# Patient Record
Sex: Female | Born: 1995
Health system: Southern US, Community
[De-identification: ages and names within clinical notes are randomized; demographics above are authoritative.]

## PROBLEM LIST (undated history)

## (undated) DIAGNOSIS — F4310 Post-traumatic stress disorder, unspecified: Secondary | ICD-10-CM

## (undated) DIAGNOSIS — F329 Major depressive disorder, single episode, unspecified: Secondary | ICD-10-CM

## (undated) DIAGNOSIS — F419 Anxiety disorder, unspecified: Secondary | ICD-10-CM

## (undated) DIAGNOSIS — F32A Depression, unspecified: Secondary | ICD-10-CM

## (undated) HISTORY — DX: Anxiety disorder, unspecified: F41.9

## (undated) HISTORY — DX: Post-traumatic stress disorder, unspecified: F43.10

## (undated) HISTORY — DX: Depression, unspecified: F32.A

---
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
## 2019-07-27 ENCOUNTER — Telehealth: Payer: Self-pay | Admitting: Obstetrics & Gynecology

## 2019-07-27 NOTE — Telephone Encounter (Signed)
We have received records from [facility] health specialist for patient to transfer care. I left voicemail for patient to call back to be schedule

## 2019-08-12 ENCOUNTER — Encounter: Payer: Self-pay | Admitting: Obstetrics and Gynecology

## 2019-08-12 ENCOUNTER — Other Ambulatory Visit (HOSPITAL_COMMUNITY)
Admission: RE | Admit: 2019-08-12 | Discharge: 2019-08-12 | Disposition: A | Discharge status: Home / Self Care | Payer: Managed Care, Other (non HMO) | Source: Ambulatory Visit | Attending: Obstetrics and Gynecology | Admitting: Obstetrics and Gynecology

## 2019-08-12 ENCOUNTER — Ambulatory Visit (INDEPENDENT_AMBULATORY_CARE_PROVIDER_SITE_OTHER): Payer: Managed Care, Other (non HMO) | Admitting: Obstetrics and Gynecology

## 2019-08-12 ENCOUNTER — Other Ambulatory Visit: Payer: Self-pay

## 2019-08-12 VITALS — BP 102/60 | Ht 64.0 in | Wt 153.0 lb

## 2019-08-12 DIAGNOSIS — Z30431 Encounter for routine checking of intrauterine contraceptive device: Secondary | ICD-10-CM

## 2019-08-12 DIAGNOSIS — Z113 Encounter for screening for infections with a predominantly sexual mode of transmission: Secondary | ICD-10-CM

## 2019-08-12 DIAGNOSIS — N926 Irregular menstruation, unspecified: Secondary | ICD-10-CM

## 2019-08-12 DIAGNOSIS — Z130 Encounter for screening for diseases of the blood and blood-forming organs and certain disorders involving the immune mechanism: Secondary | ICD-10-CM

## 2019-08-12 DIAGNOSIS — N921 Excessive and frequent menstruation with irregular cycle: Secondary | ICD-10-CM

## 2019-08-12 DIAGNOSIS — Z124 Encounter for screening for malignant neoplasm of cervix: Secondary | ICD-10-CM

## 2019-08-12 DIAGNOSIS — Z0000 Encounter for general adult medical examination without abnormal findings: Secondary | ICD-10-CM

## 2019-08-12 DIAGNOSIS — Z01411 Encounter for gynecological examination (general) (routine) with abnormal findings: Secondary | ICD-10-CM | POA: Diagnosis not present

## 2019-08-12 DIAGNOSIS — Z3189 Encounter for other procreative management: Secondary | ICD-10-CM

## 2019-08-12 NOTE — Progress Notes (Signed)
Gynecology Annual Exam  PCP: Patient, No Pcp Per  Chief Complaint:  Chief Complaint  Patient presents with  . Gynecologic Exam    History of Present Illness: Patient is a 24 y.o. G1P1001 presents for annual exam. The patient has no complaints today.   LMP: Patient's last menstrual period was 07/16/2019 (exact date). Average Interval: regular, 45 days Duration of flow: 10 days Heavy Menses: no Clots: no Intermenstrual Bleeding: no Postcoital Bleeding: no Dysmenorrhea: no  Patient reports that her periods have been irregular since she had her baby and stopped breast-feeding.  She reports that she delivered in November 2018 and breast-fed until about 1 year after that.  Her menstrual cycle resumed and July 2019.  For most of 2020 she had a regular cycle occurring every 32 days until August 2020.  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  She denies any issues with acne or abnormal hair growth.  She reports that her periods are lasting 10 days with 5 days of moderate bleeding and several days at the beginning and end of small brown spotting.  She reports that she has cramping for 1 to 4 days during her period it is usually severe for 1 day sometimes she left a miss work in school if she does not take pain medicine and enough time.  She reports that Tylenol works best to control her symptoms of menstrual pain.  She is currently sexually active with one partner she has no pain with intercourse.  She denies any history of fibroids polyps or abnormal Pap smears PCOS and endometriosis.  She currently has a copper IUD.  She will be getting married in the next couple of months and would like to be pregnant in the summer.  She is currently taking ovulation test strips and has had varying results with them being positive.  The patient is sexually active. She currently uses IUD for contraception. She denies dyspareunia.  The patient does not  perform self breast exams.  There is no notable family history of breast or ovarian cancer in her family.  The patient reports current symptoms of depression.  She seen a school mental health NP for management of her depression and anxiety. She reports it is currently the best it has ever been managed. She has higher anxiety but is stressed with school.   PHQ-9: 9 GAD-7: 14    Review of Systems: Review of Systems  Constitutional: Positive for weight loss. Negative for chills, fever and malaise/fatigue.  HENT: Negative for congestion, hearing loss and sinus pain.   Eyes: Negative for blurred vision and double vision.  Respiratory: Negative for cough, sputum production, shortness of breath and wheezing.   Cardiovascular: Negative for chest pain, palpitations, orthopnea and leg swelling.  Gastrointestinal: Positive for diarrhea, nausea and vomiting. Negative for abdominal pain and constipation.  Genitourinary: Negative for dysuria, flank pain, frequency, hematuria and urgency.  Musculoskeletal: Negative for back pain, falls and joint pain.  Skin: Negative for itching and rash.  Neurological: Positive for headaches. Negative for dizziness.  Psychiatric/Behavioral: Positive for depression. Negative for substance abuse and suicidal ideas. The patient is nervous/anxious.     Past Medical History:  There are no problems to display for this patient.   Past Surgical History:  History reviewed. No pertinent surgical history.  Gynecologic History:  Patient's last menstrual period was 07/16/2019 (exact date). Contraception: IUD Last Pap: Results were: 2019  NIL  Obstetric History: G1P1001  Family History:  Family History  Problem Relation Age of Onset  . Bladder Cancer Maternal Grandfather 47    Social History:  Social History   Socioeconomic History  . Marital status: Single    Spouse name: Not on file  . Number of children: Not on file  . Years of education: Not on file  .  Highest education level: Not on file  Occupational History  . Not on file  Tobacco Use  . Smoking status: Never Smoker  . Smokeless tobacco: Never Used  Substance and Sexual Activity  . Alcohol use: Yes    Comment: Social  . Drug use: Never  . Sexual activity: Yes    Birth control/protection: I.U.D.    Comment: [place] 2018  Other Topics Concern  . Not on file  Social History Narrative  . Not on file   Social Determinants of Health   Financial Resource Strain:   . Difficulty of Paying Living Expenses:   Food Insecurity:   . Worried About Programme researcher, broadcasting/film/video in the Last Year:   . Barista in the Last Year:   Transportation Needs:   . Freight forwarder (Medical):   Marland Kitchen Lack of Transportation (Non-Medical):   Physical Activity:   . Days of Exercise per Week:   . Minutes of Exercise per Session:   Stress:   . Feeling of Stress :   Social Connections:   . Frequency of Communication with Friends and Family:   . Frequency of Social Gatherings with Friends and Family:   . Attends Religious Services:   . Active Member of Clubs or Organizations:   . Attends Banker Meetings:   Marland Kitchen Marital Status:   Intimate Partner Violence:   . Fear of Current or Ex-Partner:   . Emotionally Abused:   Marland Kitchen Physically Abused:   . Sexually Abused:     Allergies:  Allergies  Allergen Reactions  . Bactrim [Sulfamethoxazole-Trimethoprim] Hives  . Menthol     Medications: Prior to Admission medications   Medication Sig Start Date End Date Taking? Authorizing Provider  DEPAKOTE 125 MG DR tablet  02/14/19  Yes [provider]  EFFEXOR XR 37.5 MG 24 hr capsule  02/14/19  Yes [provider]  gabapentin (NEURONTIN) 100 MG capsule Take 200 mg by mouth 2 (two) times daily.   Yes [provider]  traZODone (DESYREL) 50 MG tablet  06/24/19  Yes [provider]    Physical Exam Vitals: Blood pressure 102/60, height 5\' 4"  (1.626 m), weight 153  lb (69.4 kg), last menstrual period 07/16/2019.  General: NAD HEENT: normocephalic, anicteric Thyroid: no enlargement, no palpable nodules Pulmonary: No increased work of breathing, CTAB Cardiovascular: RRR, distal pulses 2+ Breast: Breast symmetrical, no tenderness, no palpable nodules or masses, no skin or nipple retraction present, no nipple discharge.  No axillary or supraclavicular lymphadenopathy. Abdomen: NABS, soft, non-tender, non-distended.  Umbilicus without lesions.  No hepatomegaly, splenomegaly or masses palpable. No evidence of hernia  Genitourinary:  External: Normal external female genitalia.  Normal urethral meatus, normal Bartholin's and Skene's glands.    Vagina: Normal vaginal mucosa, no evidence of prolapse.    Cervix: Grossly normal in appearance, no bleeding  Uterus: Non-enlarged, mobile, normal contour.  No CMT  Adnexa: ovaries non-enlarged, no adnexal masses  Rectal: deferred  Lymphatic: no evidence of inguinal lymphadenopathy Extremities: no edema, erythema, or tenderness Neurologic: Grossly intact Psychiatric: mood appropriate, affect full  Female chaperone  present for pelvic and breast  portions of the physical exam    Assessment: 24 y.o. G1P1001 routine annual exam  Plan: Problem List Items Addressed This Visit    None    Visit Diagnoses    Healthcare maintenance    -  Primary   Screening for cervical cancer       Irregular periods/menstrual cycles       Relevant Orders   TSH + free T4   Prolactin   Testosterone, Free, Total, SHBG   DHEA-sulfate   FSH/LH   Prolonged menstrual cycle       Relevant Orders   TSH + free T4   Prolactin   Testosterone, Free, Total, SHBG   DHEA-sulfate   FSH/LH   Screening, anemia, deficiency, iron       Relevant Orders   CBC   IUD check up       Encounter for fertility planning       Screen for STD (sexually transmitted disease)       Relevant Orders   Cervicovaginal ancillary only      1) STI  screening  was offered and accepted  2)  ASCCP guidelines and rational discussed.  Patient opts for every 3 years screening interval  3) Contraception - the patient is currently using  IUD.  She is happy with her current form of contraception and plans to continue We discussed safe sex practices to reduce her furture risk of STI's.    4) Will evaluate patient's irregular menstrual cycle with labs. She declines pelvic US at this time.   5) Discussed ovulation testing, how to determine your LH peak with test strips and timing of intercourse for conception. Advised to initiate PNV 2-3 months before conception. Provided with information regarding nutrition in pregnancy.   5) Return in about 1 year (around 08/11/2020).  Adrian Prows MD [facility], [facility] 08/12/2019 12:39 PM

## 2019-08-12 NOTE — Patient Instructions (Signed)
Institute of Medicine Recommended Dietary Allowances for Calcium and Vitamin D  Age (yr) Calcium Recommended Dietary Allowance (mg/day) Vitamin D Recommended Dietary Allowance (international units/day)  9-18 1,300 600  19-50 1,000 600  51-70 1,200 600  71 and older 1,200 800  Data from Institute of Medicine. Dietary reference intakes: calcium, vitamin D. [place], [place]: National Academies Press; 2011.      Exercising to Stay Healthy To become healthy and stay healthy, it is recommended that you do moderate-intensity and vigorous-intensity exercise. You can tell that you are exercising at a moderate intensity if your heart starts beating faster and you start breathing faster but can still hold a conversation. You can tell that you are exercising at a vigorous intensity if you are breathing much harder and faster and cannot hold a conversation while exercising. Exercising regularly is important. It has many health benefits, such as:  Improving overall fitness, flexibility, and endurance.  Increasing bone density.  Helping with weight control.  Decreasing body fat.  Increasing muscle strength.  Reducing stress and tension.  Improving overall health. How often should I exercise? Choose an activity that you enjoy, and set realistic goals. Your health care provider can help you make an activity plan that works for you. Exercise regularly as told by your health care provider. This may include:  Doing strength training two times a week, such as: ? Lifting weights. ? Using resistance bands. ? Push-ups. ? Sit-ups. ? Yoga.  Doing a certain intensity of exercise for a given amount of time. Choose from these options: ? A total of 150 minutes of moderate-intensity exercise every week. ? A total of 75 minutes of vigorous-intensity exercise every week. ? A mix of moderate-intensity and vigorous-intensity exercise every week. Children, pregnant women, people who have not  exercised regularly, people who are overweight, and older adults may need to talk with a health care provider about what activities are safe to do. If you have a medical condition, be sure to talk with your health care provider before you start a new exercise program. What are some exercise ideas? Moderate-intensity exercise ideas include:  Walking 1 mile (1.6 km) in about 15 minutes.  Biking.  Hiking.  Golfing.  Dancing.  Water aerobics. Vigorous-intensity exercise ideas include:  Walking 4.5 miles (7.2 km) or more in about 1 hour.  Jogging or running 5 miles (8 km) in about 1 hour.  Biking 10 miles (16.1 km) or more in about 1 hour.  Lap swimming.  Roller-skating or in-line skating.  Cross-country skiing.  Vigorous competitive sports, such as football, basketball, and soccer.  Jumping rope.  Aerobic dancing. What are some everyday activities that can help me to get exercise?  Yard work, such as: ? Pushing a lawn mower. ? Raking and bagging leaves.  Washing your car.  Pushing a stroller.  Shoveling snow.  Gardening.  Washing windows or floors. How can I be more active in my day-to-day activities?  Use stairs instead of an elevator.  Take a walk during your lunch break.  If you drive, park your car farther away from your work or school.  If you take public transportation, get off one stop early and walk the rest of the way.  Stand up or walk around during all of your indoor phone calls.  Get up, stretch, and walk around every 30 minutes throughout the day.  Enjoy exercise with a friend. Support to continue exercising will help you keep a regular routine of activity. What guidelines   can I follow while exercising?  Before you start a new exercise program, talk with your health care provider.  Do not exercise so much that you hurt yourself, feel dizzy, or get very short of breath.  Wear comfortable clothes and wear shoes with good support.  Drink  plenty of water while you exercise to prevent dehydration or heat stroke.  Work out until your breathing and your heartbeat get faster. Where to find more information  [place] Department of Health and Human Services: www.hhs.gov  Centers for Disease Control and Prevention (CDC): www.cdc.gov Summary  Exercising regularly is important. It will improve your overall fitness, flexibility, and endurance.  Regular exercise also will improve your overall health. It can help you control your weight, reduce stress, and improve your bone density.  Do not exercise so much that you hurt yourself, feel dizzy, or get very short of breath.  Before you start a new exercise program, talk with your health care provider. This information is not intended to replace advice given to you by your health care provider. Make sure you discuss any questions you have with your health care provider. Document Revised: 04/24/2017 Document Reviewed: 04/02/2017 Elsevier Patient Education  2020 Elsevier Inc.  Budget-Friendly Healthy Eating There are many ways to save money at the grocery store and continue to eat healthy. You can be successful if you:  Plan meals according to your budget.  Make a grocery list and only purchase food according to your grocery list.  Prepare food yourself. What are tips for following this plan?  Reading food labels  Compare food labels between brand name foods and the store brand. Often the nutritional value is the same, but the store brand is lower cost.  Look for products that do not have added sugar, fat, or salt (sodium). These often cost the same but are healthier for you. Products may be labeled as: ? Sugar-free. ? Nonfat. ? Low-fat. ? Sodium-free. ? Low-sodium.  Look for lean ground beef labeled as at least 92% lean and 8% fat. Shopping  Buy only the items on your grocery list and go only to the areas of the store that have the items on your list.  Use coupons only for  foods and brands you normally buy. Avoid buying items you wouldn't normally buy simply because they are on sale.  Check online and in newspapers for weekly deals.  Buy healthy items from the bulk bins when available, such as herbs, spices, flour, pasta, nuts, and dried fruit.  Buy fruits and vegetables that are in season. Prices are usually lower on in-season produce.  Look at the unit price on the price tag. Use it to compare different brands and sizes to find out which item is the best deal.  Choose healthy items that are often low-cost, such as carrots, potatoes, apples, bananas, and oranges. Dried or canned beans are a low-cost protein source.  Buy in bulk and freeze extra food. Items you can buy in bulk include meats, fish, poultry, frozen fruits, and frozen vegetables.  Avoid buying "ready-to-eat" foods, such as pre-cut fruits and vegetables and pre-made salads.  If possible, shop around to discover where you can find the best prices. Consider other retailers such as dollar stores, larger wholesale stores, local fruit and vegetable stands, and farmers markets.  Do not shop when you are hungry. If you shop while hungry, it may be hard to stick to your list and budget.  Resist impulse buying. Use your grocery list as   your official plan for the week.  Buy a variety of vegetables and fruits by purchasing fresh, frozen, and canned items.  Look at the top and bottom shelves for deals. Foods at eye level (eye level of an adult or child) are usually more expensive.  Be efficient with your time when shopping. The more time you spend at the store, the more money you are likely to spend.  To save money when choosing more expensive foods like meats and dairy: ? Choose cheaper cuts of meat, such as bone-in chicken thighs and drumsticks instead of skinless and boneless chicken. When you are ready to prepare the chicken, you can remove the skin yourself to make it healthier. ? Choose lean meats  like chicken or turkey instead of beef. ? Choose canned seafood, such as tuna, salmon, or sardines. ? Buy eggs as a low-cost source of protein. ? Buy dried beans and peas, such as lentils, split peas, or kidney beans instead of meats. Dried beans and peas are a good alternative source of protein. ? Buy the larger tubs of yogurt instead of individual-sized containers.  Choose water instead of sodas and other sweetened beverages.  Avoid buying chips, cookies, and other "junk food." These items are usually expensive and not healthy. Cooking  Make extra food and freeze the extras in meal-sized containers or in individual portions for fast meals and snacks.  Pre-cook on days when you have extra time to prepare meals in advance. You can keep these meals in the fridge or freezer and reheat for a quick meal.  When you come home from the grocery store, wash, peel, and cut fruits and vegetables so they are ready to use and eat. This will help reduce food waste. Meal planning  Do not eat out or get fast food. Prepare food at home.  Make a grocery list and make sure to bring it with you to the store. If you have a smart phone, you could use your phone to create your shopping list.  Plan meals and snacks according to a grocery list and budget you create.  Use leftovers in your meal plan for the week.  Look for recipes where you can cook once and make enough food for two meals.  Include budget-friendly meals like stews, casseroles, and stir-fry dishes.  Try some meatless meals or try "no cook" meals like salads.  Make sure that half your plate is filled with fruits or vegetables. Choose from fresh, frozen, or canned fruits and vegetables. If eating canned, remember to rinse them before eating. This will remove any excess salt added for packaging. Summary  Eating healthy on a budget is possible if you plan your meals according to your budget, purchase according to your budget and grocery list,  and prepare food yourself.  Tips for buying more food on a limited budget include buying generic brands, using coupons only for foods you normally buy, and buying healthy items from the bulk bins when available.  Tips for buying cheaper food to replace expensive food include choosing cheaper, lean cuts of meat, and buying dried beans and peas. This information is not intended to replace advice given to you by your health care provider. Make sure you discuss any questions you have with your health care provider. Document Revised: 05/13/2017 Document Reviewed: 05/13/2017 Elsevier Patient Education  2020 Elsevier Inc.  

## 2019-08-16 LAB — CERVICOVAGINAL ANCILLARY ONLY
Chlamydia: NEGATIVE
Comment: NEGATIVE
Comment: NORMAL
Neisseria Gonorrhea: NEGATIVE

## 2019-08-17 LAB — FSH/LH
FSH: 6.4 m[IU]/mL
LH: 14.3 m[IU]/mL

## 2019-08-17 LAB — CBC
Hematocrit: 37 % (ref 34.0–46.6)
Hemoglobin: 11.8 g/dL (ref 11.1–15.9)
MCH: 27 pg (ref 26.6–33.0)
MCHC: 31.9 g/dL (ref 31.5–35.7)
MCV: 85 fL (ref 79–97)
Platelets: 221 10*3/uL (ref 150–450)
RBC: 4.37 x10E6/uL (ref 3.77–5.28)
RDW: 14 % (ref 11.7–15.4)
WBC: 8.7 10*3/uL (ref 3.4–10.8)

## 2019-08-17 LAB — TSH+FREE T4
Free T4: 1.09 ng/dL (ref 0.82–1.77)
TSH: 2.77 u[IU]/mL (ref 0.450–4.500)

## 2019-08-17 LAB — TESTOSTERONE, FREE, TOTAL, SHBG
Sex Hormone Binding: 47 nmol/L (ref 24.6–122.0)
Testosterone, Free: 3.2 pg/mL (ref 0.0–4.2)
Testosterone: 40 ng/dL (ref 8–48)

## 2019-08-17 LAB — DHEA-SULFATE: DHEA-SO4: 272 ug/dL (ref 110.0–431.7)

## 2019-08-17 LAB — PROLACTIN: Prolactin: 13.3 ng/mL (ref 4.8–23.3)

## 2020-01-17 ENCOUNTER — Ambulatory Visit (INDEPENDENT_AMBULATORY_CARE_PROVIDER_SITE_OTHER): Payer: Managed Care, Other (non HMO) | Admitting: Obstetrics and Gynecology

## 2020-01-17 ENCOUNTER — Other Ambulatory Visit: Payer: Self-pay

## 2020-01-17 ENCOUNTER — Encounter: Payer: Self-pay | Admitting: Obstetrics and Gynecology

## 2020-01-17 VITALS — BP 118/72 | Ht 64.0 in | Wt 150.0 lb

## 2020-01-17 DIAGNOSIS — Z30432 Encounter for removal of intrauterine contraceptive device: Secondary | ICD-10-CM

## 2020-01-17 NOTE — Patient Instructions (Signed)
Initial steps to help :   B6 (pyridoxine) 25 mg,  3-4 times a day- 200 mg a day total Unisom (doxylamine) 25 mg at bedtime **B6 and Unisom are available as a combination prescription medications called diclegis and bonjesta  B1 (thiamin)  50-100 mg 1-2 a day-  100 mg a day total  Continue prenatal vitamin with iron and thiamin. If it is not tolerated switch to 1 mg of folic acid.  Can add medication for gastric reflux if needed.  Subsequent steps to be added to B1, B6, and Unisom:  1. Antihistamine (one of the following medications) Dramamine      25-50 mg every 4-6 hours Benadryl      25-50 mg every 4-6 hours Meclizine      25 mg every 6 hours  2. Dopamine Antagonist (one of the following medications) Metoclopramide  (Reglan)  5-10 mg every 6-8 hours         PO Promethazine   (Phenergan)   12.5-25 mg every 4-6 hours      PO or rectal Prochlorperazine  (Compazine)  5-10 mg every 6-8 hours     25mg BID rectally   Subsequent steps if there has still not been improvement in symptoms:  3. Daily stool softner:  Colace 100 mg twice a day  4. Ondansetron  (Zofran)   4-8 mg every 6-8 hours    Hyperemesis Gravidarum Hyperemesis gravidarum is a severe form of nausea and vomiting that happens during pregnancy. Hyperemesis is worse than morning sickness. It may cause you to have nausea or vomiting all day for many days. It may keep you from eating and drinking enough food and liquids, which can lead to dehydration, malnutrition, and weight loss. Hyperemesis usually occurs during the first half (the first 20 weeks) of pregnancy. It often goes away once a woman is in her second half of pregnancy. However, sometimes hyperemesis continues through an entire pregnancy. What are the causes? The cause of this condition is not known. It may be related to changes in chemicals (hormones) in the body during pregnancy, such as the high level of pregnancy hormone (human chorionic gonadotropin) or the increase  in the female sex hormone (estrogen). What are the signs or symptoms? Symptoms of this condition include:  Nausea that does not go away.  Vomiting that does not allow you to keep any food down.  Weight loss.  Body fluid loss (dehydration).  Having no desire to eat, or not liking food that you have previously enjoyed. How is this diagnosed? This condition may be diagnosed based on:  A physical exam.  Your medical history.  Your symptoms.  Blood tests.  Urine tests. How is this treated? This condition is managed by controlling symptoms. This may include:  Following an eating plan. This can help lessen nausea and vomiting.  Taking prescription medicines. An eating plan and medicines are often used together to help control symptoms. If medicines do not help relieve nausea and vomiting, you may need to receive fluids through an IV at the hospital. Follow these instructions at home: Eating and drinking   Avoid the following: ? Drinking fluids with meals. Try not to drink anything during the 30 minutes before and after your meals. ? Drinking more than 1 cup of fluid at a time. ? Eating foods that trigger your symptoms. These may include spicy foods, coffee, high-fat foods, very sweet foods, and acidic foods. ? Skipping meals. Nausea can be more intense on an empty stomach. If   you cannot tolerate food, do not force it. Try sucking on ice chips or other frozen items and make up for missed calories later. ? Lying down within 2 hours after eating. ? Being exposed to environmental triggers. These may include food smells, smoky rooms, closed spaces, rooms with strong smells, warm or humid places, overly loud and noisy rooms, and rooms with motion or flickering lights. Try eating meals in a well-ventilated area that is free of strong smells. ? Quick and sudden changes in your movement. ? Taking iron pills and multivitamins that contain iron. If you take prescription iron pills, do not  stop taking them unless your health care provider approves. ? Preparing food. The smell of food can spoil your appetite or trigger nausea.  To help relieve your symptoms: ? Listen to your body. Everyone is different and has different preferences. Find what works best for you. ? Eat and drink slowly. ? Eat 5-6 small meals daily instead of 3 large meals. Eating small meals and snacks can help you avoid an empty stomach. ? In the morning, before getting out of bed, eat a couple of crackers to avoid moving around on an empty stomach. ? Try eating starchy foods as these are usually tolerated well. Examples include cereal, toast, bread, potatoes, pasta, rice, and pretzels. ? Include at least 1 serving of protein with your meals and snacks. Protein options include lean meats, poultry, seafood, beans, nuts, nut butters, eggs, cheese, and yogurt. ? Try eating a protein-rich snack before bed. Examples of a protein-rick snack include cheese and crackers or a peanut butter sandwich made with 1 slice of whole-wheat bread and 1 tsp (5 g) of peanut butter. ? Eat or suck on things that have ginger in them. It may help relieve nausea. Add  tsp ground ginger to hot tea or choose ginger tea. ? Try drinking 100% fruit juice or an electrolyte drink. An electrolyte drink contains sodium, potassium, and chloride. ? Drink fluids that are cold, clear, and carbonated or sour. Examples include lemonade, ginger ale, lemon-lime soda, ice water, and sparkling water. ? Brush your teeth or use a mouth rinse after meals. ? Talk with your health care provider about starting a supplement of vitamin B6. General instructions  Take over-the-counter and prescription medicines only as told by your health care provider.  Follow instructions from your health care provider about eating or drinking restrictions.  Continue to take your prenatal vitamins as told by your health care provider. If you are having trouble taking your prenatal  vitamins, talk with your health care provider about different options.  Keep all follow-up and pre-birth (prenatal) visits as told by your health care provider. This is important. Contact a health care provider if:  You have pain in your abdomen.  You have a severe headache.  You have vision problems.  You are losing weight.  You feel weak or dizzy. Get help right away if:  You cannot drink fluids without vomiting.  You vomit blood.  You have constant nausea and vomiting.  You are very weak.  You faint.  You have a fever and your symptoms suddenly get worse. Summary  Hyperemesis gravidarum is a severe form of nausea and vomiting that happens during pregnancy.  Making some changes to your eating habits may help relieve nausea and vomiting.  This condition may be managed with medicine.  If medicines do not help relieve nausea and vomiting, you may need to receive fluids through an IV at the hospital.   This information is not intended to replace advice given to you by your health care provider. Make sure you discuss any questions you have with your health care provider. Document Revised: 06/01/2017 Document Reviewed: 01/09/2016 Elsevier Patient Education  2020 Elsevier Inc.  

## 2020-01-17 NOTE — Progress Notes (Signed)
  History of Present Illness:  Angela Morrow is a 24 y.o. that had a Paragard IUD placed approximately 3 years ago. Since that time, she states that she was happy with the IUD.  The following portions of the patient's history were reviewed and updated as appropriate: allergies, current medications, past family history, past medical history, past social history, past surgical history and problem list.  There are no problems to display for this patient.  Medications:  Current Outpatient Medications on File Prior to Visit  Medication Sig Dispense Refill  . DEPAKOTE 125 MG DR tablet     . EFFEXOR XR 37.5 MG 24 hr capsule     . gabapentin (NEURONTIN) 100 MG capsule Take 200 mg by mouth 2 (two) times daily.    . traZODone (DESYREL) 50 MG tablet      No current facility-administered medications on file prior to visit.   Allergies: is allergic to bactrim [sulfamethoxazole-trimethoprim] and menthol.  Physical Exam:  BP 118/72   Ht 5\' 4"  (1.626 m)   Wt 150 lb (68 kg)   BMI 25.75 kg/m  Body mass index is 25.75 kg/m. Constitutional: Well nourished, well developed female in no acute distress.  Abdomen: diffusely non tender to palpation, non distended, and no masses, hernias Neuro: Grossly intact Psych:  Normal mood and affect.    Pelvic exam:  Two IUD strings present seen coming from the cervical os. EGBUS, vaginal vault and cervix: within normal limits  IUD Removal Strings of IUD identified and grasped.  IUD removed without problem.  Pt tolerated this well.  IUD noted to be intact.  Assessment: IUD Removal  Plan: IUD removed and plan for contraception is no method. She was amenable to this plan.  MD, [facility], [facility] 01/17/2020 11:44 AM

## 2020-06-01 ENCOUNTER — Ambulatory Visit: Payer: Managed Care, Other (non HMO) | Admitting: Obstetrics and Gynecology

## 2020-08-06 ENCOUNTER — Ambulatory Visit (INDEPENDENT_AMBULATORY_CARE_PROVIDER_SITE_OTHER): Payer: No Typology Code available for payment source | Admitting: Obstetrics and Gynecology

## 2020-08-06 ENCOUNTER — Encounter: Payer: Self-pay | Admitting: Obstetrics and Gynecology

## 2020-08-06 ENCOUNTER — Other Ambulatory Visit: Payer: Self-pay

## 2020-08-06 VITALS — BP 110/70 | Ht 64.0 in | Wt 153.0 lb

## 2020-08-06 DIAGNOSIS — N912 Amenorrhea, unspecified: Secondary | ICD-10-CM

## 2020-08-06 DIAGNOSIS — N839 Noninflammatory disorder of ovary, fallopian tube and broad ligament, unspecified: Secondary | ICD-10-CM | POA: Diagnosis not present

## 2020-08-06 MED ORDER — MEDROXYPROGESTERONE ACETATE 10 MG PO TABS: 10.0000 mg | ORAL_TABLET | Freq: Every day | ORAL | 6 refills | Status: DC

## 2020-08-06 MED ORDER — LETROZOLE 2.5 MG PO TABS: 2.5000 mg | ORAL_TABLET | Freq: Every day | ORAL | 0 refills | Status: AC

## 2020-08-06 NOTE — Progress Notes (Signed)
Patient ID: Angela Morrow, female   DOB: [birth year], 25 y.o.   MRN: [id number]  Reason for Consult: Infertility   Referred by No ref. provider found  Subjective:     HPI:  Angela Morrow is a 25 y.o. female. She would like to obtain pregnancy. She reports that she has been having irregular ovulation . She has been using home LH testing strips. She says that she has only ovulated 4 times in the last 6 months. Her ovulation occurred on cycle days 75, 27, 35, and 61.  She notes that she has a shorter luteal phase and generally her period start 10 days after a positive ovulation test.   Her partner has not had prior surgery to his groin or pelvis. He has not had prior chemo or radiation.   Gynecological History  Patient's last menstrual period was 07/18/2020. Menarche: 13 History of fibroids, polyps, or ovarian cysts? : no  History of PCOS? no Hstory of Endometriosis? no History of abnormal pap smears? no Have you had any sexually transmitted infections in the past? on  She has had HPV vaccination in the past.   Last Pap: Results were: 2019 NIL  She identifies as a female. She is sexually active with men.  She denies dyspareunia. She denies postcoital bleeding.  She currently uses none for contraception.   Obstetrical History OB History  Gravida Para Term Preterm AB Living  1 1 1     1   SAB IAB Ectopic Multiple Live Births          1    # Outcome Date GA Lbr Len/2nd Weight Sex Delivery Anes PTL Lv  1 Term 04/23/17 [redacted]w[redacted]d  7 lb 10 oz (3.459 kg) F Vag-Spont   LIV     Past Medical History:  Diagnosis Date  . Anxiety   . Depression   . PTSD (post-traumatic stress disorder)    Family History  Problem Relation Age of Onset  . Bladder Cancer Maternal Grandfather 25   History reviewed. No pertinent surgical history.  Short Social History:  Social History   Tobacco Use  . Smoking status: Never Smoker  . Smokeless tobacco: Never Used  Substance Use Topics  . Alcohol  use: Yes    Comment: Social    Allergies  Allergen Reactions  . Bactrim [Sulfamethoxazole-Trimethoprim] Hives  . Menthol     Current Outpatient Medications  Medication Sig Dispense Refill  . EFFEXOR XR 37.5 MG 24 hr capsule     . KLONOPIN 0.5 MG tablet Take 0.25 mg by mouth 2 (two) times daily as needed.    [redacted]w[redacted]d LATUDA 20 MG TABS tablet take 2 tablets for one week    . propranolol (INDERAL) 10 MG tablet Take 10 mg by mouth 2 (two) times daily.    . traZODone (DESYREL) 50 MG tablet     . DEPAKOTE 125 MG DR tablet  (Patient not taking: Reported on 08/06/2020)    . gabapentin (NEURONTIN) 100 MG capsule Take 200 mg by mouth 2 (two) times daily. (Patient not taking: Reported on 08/06/2020)     No current facility-administered medications for this visit.    Review of Systems  Constitutional: Negative for chills, fatigue, fever and unexpected weight change.  HENT: Negative for trouble swallowing.  Eyes: Negative for loss of vision.  Respiratory: Negative for cough, shortness of breath and wheezing.  Cardiovascular: Negative for chest pain, leg swelling, palpitations and syncope.  GI: Negative for abdominal pain, blood in stool, diarrhea, nausea  and vomiting.  GU: Negative for difficulty urinating, dysuria, frequency and hematuria.  Musculoskeletal: Negative for back pain, leg pain and joint pain.  Skin: Negative for rash.  Neurological: Negative for dizziness, headaches, light-headedness, numbness and seizures.  Psychiatric: Negative for behavioral problem, confusion, depressed mood and sleep disturbance.        Objective:  Objective   Vitals:   08/06/20 1402  BP: 110/70  Weight: 153 lb (69.4 kg)  Height: 5\' 4"  (1.626 m)   Body mass index is 26.26 kg/m.  Physical Exam Vitals and nursing note reviewed. Exam conducted with a chaperone present.  Constitutional:      Appearance: Normal appearance.  HENT:     Head: Normocephalic and atraumatic.  Eyes:     Extraocular  Movements: Extraocular movements intact.     Pupils: Pupils are equal, round, and reactive to light.  Cardiovascular:     Rate and Rhythm: Normal rate and regular rhythm.  Pulmonary:     Effort: Pulmonary effort is normal.     Breath sounds: Normal breath sounds.  Abdominal:     General: Abdomen is flat.     Palpations: Abdomen is soft.  Musculoskeletal:     Cervical back: Normal range of motion.  Skin:    General: Skin is warm and dry.  Neurological:     General: No focal deficit present.     Mental Status: She is alert and oriented to person, place, and time.  Psychiatric:        Behavior: Behavior normal.        Thought Content: Thought content normal.        Judgment: Judgment normal.     Assessment/Plan:     25 yo with oligo ovulation Will start ovulation medications later this month. She is currently on cycle day 20,. If she does not have a menstrual cycle by cycle day 35 she will take a pregnancy test. If her pregnancy test is negative she will take 10 days of progesterone to bring on a menstrual cycle. She will then initiate letrozole as directed.   Follow up for pelvic 25, HSG and semen analysis. Patient had lab evaluation of irregular menses last year which was largely normal.   More than 40 minutes were spent face to face with the patient in the room, reviewing the medical record, labs and images, and coordinating care for the patient. The plan of management was discussed in detail and counseling was provided.      [place] MD [facility], [facility] 08/06/2020 2:35 PM

## 2020-08-06 NOTE — Patient Instructions (Signed)
Ovulation Induction Instructions/Schedule: To use with clomiphene citrate (Clomid) or letrozole (Femara)  1. Day 1 of your cycle is the first day of bleeding, whether it happens with a naturally occurring period or is induced with progesterone. Starting on Day 3 of the cycle, take the prescribed medication Letrozole (Femara) for five consecutive days.   2. Starting on Day 9, you should check your urine daily for ovulation with a commercially available ovulation predictor kit. We recommend the Clear Blue Easy kit for all of our patients because it is easy to interpret. It is available at amazon.com for low cost. There will be a "smiley face" that appears on the day of "surge," which is release of LH (leutenizing hormone). This hormone triggers ovulation. Most women will ovulate, or release an egg, 24 hours after this trigger. Other commercially available kits use a system similar to a pregnancy test kit, with a control line and a test line. When the test line brightness of color is equivalent to that of the control line, the result is positive.   3. Begin timed intercourse on Day 9. Specifically, we recommend that you have intercourse every 36-48 hours for 5 days prior to ovulation and for 5 days after. The most fertile time will be 24 hours after the "smiley face" appears on your kit, usually around Day 14 for women who have a 28 day cycle.   4. If you do not have a "smiley face" or color change on your ovulation predictor kit during your cycle, please contact us. A blood test for progesterone needs to be drawn at approximately Days 22-24 of your cycle. This will help us determine whether or not the kit is functioning properly and whether or not you are truly ovulating. It will also help us to determine if the dose of your medication needs to adjustment.   5. If no menstrual bleeding has occurred by Day 35, please check a pregnancy test and call us with the results.   6. If you require medication to have  a period, then this will be artificially induced with progesterone for each cycle, and prescribed by your doctor. Take one tablet of medroxyprogesterone acetate, 10 mg, per day for ten days. The bleeding/period should arrive within 1-5 days of finishing the tablets. The first day of bleeding is Day 1.   7. PLEASE NOTE that careful timing is required to use these medications and appropriately time your cycles. We rely on you, the patient, to time your cycles and to keep us informed. GET A CALENDAR! There are wonderful apps available for your mobile phone or laptop. One that we like is fertility friend, available at fertilityfriend.com.    8. Occasionally it is unclear whether or not ovulation has occurred. If you are unsure, please call us. Your doctor or one of our nurses will guide you to the next step, which will be either a blood test Day 22-24 for progesterone levels or may involve ultrasound to look for mature follicles (small cysts that can release an egg at the time of ovulation).  9. There is a great book, Taking Charge of Your Fertility, by Toni Weschler, that can help you to understand the hormonal nature of the menstrual cycle. The more knowledge you have about how conception occurs, the better you will understand these prescribed treatments. Knowledge will help you to work with us on achieving a conception as efficiently and as quickly as possible!   10. Generally, most patients will use ovulation induction medications   for 6 cycles. If unsuccessful, your doctor will talk to you about moving to the next step.  

## 2020-08-08 ENCOUNTER — Telehealth: Payer: Self-pay

## 2020-08-08 NOTE — Telephone Encounter (Signed)
Left msg for pt advising of scheduled u/s appt, 08/24/20 @ 4:30. Arrive at 4:00 to [facility] to check in. Full bladder.  Told pt to call the office to schedule her follow up appt.

## 2020-08-24 ENCOUNTER — Ambulatory Visit
Admission: RE | Admit: 2020-08-24 | Discharge: 2020-08-24 | Disposition: A | Discharge status: Home / Self Care | Payer: BC Managed Care – PPO | Source: Ambulatory Visit | Attending: Obstetrics and Gynecology | Admitting: Obstetrics and Gynecology

## 2020-08-24 ENCOUNTER — Other Ambulatory Visit: Payer: Self-pay

## 2020-08-24 DIAGNOSIS — N912 Amenorrhea, unspecified: Secondary | ICD-10-CM | POA: Diagnosis not present

## 2020-09-03 ENCOUNTER — Encounter: Payer: Self-pay | Admitting: Obstetrics and Gynecology

## 2020-09-03 ENCOUNTER — Ambulatory Visit (INDEPENDENT_AMBULATORY_CARE_PROVIDER_SITE_OTHER): Payer: No Typology Code available for payment source | Admitting: Obstetrics and Gynecology

## 2020-09-03 ENCOUNTER — Other Ambulatory Visit: Payer: Self-pay

## 2020-09-03 VITALS — BP 112/70 | Ht 64.0 in | Wt 154.0 lb

## 2020-09-03 DIAGNOSIS — N912 Amenorrhea, unspecified: Secondary | ICD-10-CM

## 2020-09-03 DIAGNOSIS — N839 Noninflammatory disorder of ovary, fallopian tube and broad ligament, unspecified: Secondary | ICD-10-CM

## 2020-09-03 NOTE — Patient Instructions (Signed)
Dr. Manual Meier is the reproductive specialist with [facility]. To schedule an appointment call [phone].

## 2020-09-03 NOTE — Progress Notes (Signed)
Patient ID: Angela Morrow, female   DOB: 1995-09-30, 25 y.o.   MRN: [id number]  Reason for Consult: Gynecologic Exam   Referred by No ref. provider found  Subjective:     HPI:  Angela Morrow is a 25 y.o. female. She is following up today for infertility evaluation. She just completed 10 days of progesterone on 08/30/2020 and is waiting for bleeding. Her pelvic US was normal.  She called her significant other Henderson [place] on the phone. We reviewed his semen analysis result which showed an elevated white count, but was otherwise normal. She has a few questions about how to move forward with letrozole for ovulation induction.   Past Medical History:  Diagnosis Date  . Anxiety   . Depression   . PTSD (post-traumatic stress disorder)    Family History  Problem Relation Age of Onset  . Bladder Cancer Maternal Grandfather 25   History reviewed. No pertinent surgical history.  Short Social History:  Social History   Tobacco Use  . Smoking status: Never Smoker  . Smokeless tobacco: Never Used  Substance Use Topics  . Alcohol use: Yes    Comment: Social    Allergies  Allergen Reactions  . Bactrim [Sulfamethoxazole-Trimethoprim] Hives  . Menthol     Current Outpatient Medications  Medication Sig Dispense Refill  . DEPAKOTE 125 MG DR tablet  (Patient not taking: Reported on 08/06/2020)    . EFFEXOR XR 37.5 MG 24 hr capsule     . gabapentin (NEURONTIN) 100 MG capsule Take 200 mg by mouth 2 (two) times daily. (Patient not taking: Reported on 08/06/2020)    . KLONOPIN 0.5 MG tablet Take 0.25 mg by mouth 2 (two) times daily as needed.    . lamoTRIgine (LAMICTAL) 25 MG tablet Take by mouth.    Marland Kitchen LATUDA 20 MG TABS tablet take 2 tablets for one week    . medroxyPROGESTERone (PROVERA) 10 MG tablet Take 1 tablet (10 mg total) by mouth daily. Use for ten days 10 tablet 6  . propranolol (INDERAL) 10 MG tablet Take 10 mg by mouth 2 (two) times daily.    . traZODone (DESYREL) 50 MG tablet       No current facility-administered medications for this visit.    Review of Systems  Constitutional: Negative for chills, fatigue, fever and unexpected weight change.  HENT: Negative for trouble swallowing.  Eyes: Negative for loss of vision.  Respiratory: Negative for cough, shortness of breath and wheezing.  Cardiovascular: Negative for chest pain, leg swelling, palpitations and syncope.  GI: Negative for abdominal pain, blood in stool, diarrhea, nausea and vomiting.  GU: Negative for difficulty urinating, dysuria, frequency and hematuria.  Musculoskeletal: Negative for back pain, leg pain and joint pain.  Skin: Negative for rash.  Neurological: Negative for dizziness, headaches, light-headedness, numbness and seizures.  Psychiatric: Negative for behavioral problem, confusion, depressed mood and sleep disturbance.        Objective:  Objective   Vitals:   09/03/20 1440  BP: 112/70  Weight: 154 lb (69.9 kg)  Height: 5\' 4"  (1.626 m)   Body mass index is 26.43 kg/m.  Physical Exam Vitals and nursing note reviewed. Exam conducted with a chaperone present.  Constitutional:      Appearance: Normal appearance.  HENT:     Head: Normocephalic and atraumatic.  Eyes:     Extraocular Movements: Extraocular movements intact.     Pupils: Pupils are equal, round, and reactive to light.  Cardiovascular:  Rate and Rhythm: Normal rate and regular rhythm.  Pulmonary:     Effort: Pulmonary effort is normal.     Breath sounds: Normal breath sounds.  Abdominal:     General: Abdomen is flat.     Palpations: Abdomen is soft.  Musculoskeletal:     Cervical back: Normal range of motion.  Skin:    General: Skin is warm and dry.  Neurological:     General: No focal deficit present.     Mental Status: She is alert and oriented to person, place, and time.  Psychiatric:        Behavior: Behavior normal.        Thought Content: Thought content normal.        Judgment: Judgment normal.     Assessment/Plan:     25 yo with infertility 1. Oligo-ovulation/ amenorrhea- s/p progesterone challenge, awaiting period. If no bleeding by Wednesday will send me a mychart message. Reviewed instructions for taking letrozole in event that bleeding starts and when to call and plan follow up.  2. Pyospermia- provided the name of Dr. Humberto Seals infertility urologist for follow up. Given hand out regarding pyospermia and discussed some of the initial OTC treatments which can be started. 3. Prefers to wait for further imaging of fallopian tubes/ uterine cavity until ovulation is addressed  More than 20 minutes were spent face to face with the patient in the room, reviewing the medical record, labs and images, and coordinating care for the patient. The plan of management was discussed in detail and counseling was provided.       Adelene Idler MD [facility], [facility] 09/03/2020 6:13 PM

## 2020-09-19 DIAGNOSIS — F331 Major depressive disorder, recurrent, moderate: Secondary | ICD-10-CM | POA: Diagnosis not present

## 2020-09-24 ENCOUNTER — Ambulatory Visit (INDEPENDENT_AMBULATORY_CARE_PROVIDER_SITE_OTHER): Payer: BC Managed Care – PPO | Admitting: Obstetrics and Gynecology

## 2020-09-24 ENCOUNTER — Encounter: Payer: Self-pay | Admitting: Obstetrics and Gynecology

## 2020-09-24 ENCOUNTER — Other Ambulatory Visit: Payer: Self-pay

## 2020-09-24 VITALS — BP 110/72 | Ht 64.0 in | Wt 154.0 lb

## 2020-09-24 DIAGNOSIS — N839 Noninflammatory disorder of ovary, fallopian tube and broad ligament, unspecified: Secondary | ICD-10-CM

## 2020-09-24 DIAGNOSIS — N912 Amenorrhea, unspecified: Secondary | ICD-10-CM | POA: Diagnosis not present

## 2020-09-24 MED ORDER — MEDROXYPROGESTERONE ACETATE 10 MG PO TABS
10.0000 mg | ORAL_TABLET | Freq: Every day | ORAL | 6 refills | Status: DC
Start: 2020-09-24 — End: 2020-12-25

## 2020-09-24 NOTE — Progress Notes (Signed)
Patient ID: Angela Morrow, female   DOB: Dec 22, 1995, 25 y.o.   MRN: [id number]  Reason for Consult: Infertility (RM 1)   Referred by No ref. provider found  Subjective:     HPI:  Angela Morrow is a 25 y.o. female.  She is following up today regarding oligoovulation.  She took 2.5 mg of letrozole epic the beginning of this month to help with ovulation induction for cycle days 3 through 7.  She has been doing home ovulation test strips.  In reviewing them on the app she may have had ovulation between cycle days 13-14 or on cycle day 20.  She is currently cycle day 21.  She has been having regular every other day intercourse.  Her significant other had a semen analysis which showed increased white blood cell and he is planning on following up with [facility] with urology.    Past Medical History:  Diagnosis Date  . Anxiety   . Depression   . PTSD (post-traumatic stress disorder)    Family History  Problem Relation Age of Onset  . Bladder Cancer Maternal Grandfather 25   History reviewed. No pertinent surgical history.  Short Social History:  Social History   Tobacco Use  . Smoking status: Never Smoker  . Smokeless tobacco: Never Used  Substance Use Topics  . Alcohol use: Yes    Comment: Social    Allergies  Allergen Reactions  . Bactrim [Sulfamethoxazole-Trimethoprim] Hives  . Menthol     Current Outpatient Medications  Medication Sig Dispense Refill  . EFFEXOR XR 37.5 MG 24 hr capsule     . KLONOPIN 0.5 MG tablet Take 0.25 mg by mouth 2 (two) times daily as needed.    Marland Kitchen letrozole (FEMARA) 2.5 MG tablet Take 2.5 mg by mouth daily.    Marland Kitchen Lumateperone Tosylate 42 MG CAPS Take by mouth.    . medroxyPROGESTERone (PROVERA) 10 MG tablet Take 1 tablet (10 mg total) by mouth daily. Use for ten days 10 tablet 6  . propranolol (INDERAL) 10 MG tablet Take 10 mg by mouth 2 (two) times daily.    . traZODone (DESYREL) 50 MG tablet     . DEPAKOTE 125 MG DR tablet  (Patient not  taking: Reported on 08/06/2020)    . gabapentin (NEURONTIN) 100 MG capsule Take 200 mg by mouth 2 (two) times daily. (Patient not taking: Reported on 08/06/2020)    . lamoTRIgine (LAMICTAL) 25 MG tablet Take by mouth. (Patient not taking: Reported on 09/24/2020)    . LATUDA 20 MG TABS tablet take 2 tablets for one week (Patient not taking: Reported on 09/24/2020)     No current facility-administered medications for this visit.    Review of Systems  Constitutional: Negative for chills, fatigue, fever and unexpected weight change.  HENT: Negative for trouble swallowing.  Eyes: Negative for loss of vision.  Respiratory: Negative for cough, shortness of breath and wheezing.  Cardiovascular: Negative for chest pain, leg swelling, palpitations and syncope.  GI: Negative for abdominal pain, blood in stool, diarrhea, nausea and vomiting.  GU: Negative for difficulty urinating, dysuria, frequency and hematuria.  Musculoskeletal: Negative for back pain, leg pain and joint pain.  Skin: Negative for rash.  Neurological: Negative for dizziness, headaches, light-headedness, numbness and seizures.  Psychiatric: Negative for behavioral problem, confusion, depressed mood and sleep disturbance.        Objective:  Objective   Vitals:   09/24/20 1451  BP: 110/72  Weight: 154 lb (69.9 kg)  Height: 5\' 4"  (1.626 m)   Body mass index is 26.43 kg/m.  Physical Exam Vitals and nursing note reviewed. Exam conducted with a chaperone present.  Constitutional:      Appearance: Normal appearance.  HENT:     Head: Normocephalic and atraumatic.  Eyes:     Extraocular Movements: Extraocular movements intact.     Pupils: Pupils are equal, round, and reactive to light.  Cardiovascular:     Rate and Rhythm: Normal rate and regular rhythm.  Pulmonary:     Effort: Pulmonary effort is normal.     Breath sounds: Normal breath sounds.  Abdominal:     General: Abdomen is flat.     Palpations: Abdomen is soft.   Musculoskeletal:     Cervical back: Normal range of motion.  Skin:    General: Skin is warm and dry.  Neurological:     General: No focal deficit present.     Mental Status: She is alert and oriented to person, place, and time.  Psychiatric:        Behavior: Behavior normal.        Thought Content: Thought content normal.        Judgment: Judgment normal.     Assessment/Plan:     25 year old with oligoovulation She is on cycle day 21 today.  Will obtain progesterone testing today to determine if letrozole dose is sufficient or may need to be increased.  We will telephone the patient tomorrow when the result comes back.  We will determine follow-up in office over phone pending results as well. Reviewed that if the patient reaches cycle day 35 and has not had a menstrual cycle she will take a pregnancy test.  If the pregnancy test is negative she will start 10 days of progesterone and then await menstrual bleeding to begin her second round of letrozole.   More than 15 minutes were spent face to face with the patient in the room, reviewing the medical record, labs and images, and coordinating care for the patient. The plan of management was discussed in detail and counseling was provided.   25 MD, [facility], [facility] 09/24/2020 3:40 PM

## 2020-09-25 ENCOUNTER — Other Ambulatory Visit: Payer: Self-pay | Admitting: Obstetrics and Gynecology

## 2020-09-25 DIAGNOSIS — N839 Noninflammatory disorder of ovary, fallopian tube and broad ligament, unspecified: Secondary | ICD-10-CM

## 2020-09-25 LAB — PROGESTERONE: Progesterone: 0.2 ng/mL

## 2020-09-25 MED ORDER — LETROZOLE 2.5 MG PO TABS: 5.0000 mg | ORAL_TABLET | Freq: Every day | ORAL | 0 refills | Status: AC

## 2020-10-09 DIAGNOSIS — F331 Major depressive disorder, recurrent, moderate: Secondary | ICD-10-CM | POA: Diagnosis not present

## 2020-10-30 DIAGNOSIS — F331 Major depressive disorder, recurrent, moderate: Secondary | ICD-10-CM | POA: Diagnosis not present

## 2020-11-12 ENCOUNTER — Other Ambulatory Visit: Payer: Self-pay | Admitting: Obstetrics and Gynecology

## 2020-11-12 DIAGNOSIS — N839 Noninflammatory disorder of ovary, fallopian tube and broad ligament, unspecified: Secondary | ICD-10-CM

## 2020-11-12 MED ORDER — LETROZOLE 2.5 MG PO TABS: 7.5000 mg | ORAL_TABLET | Freq: Every day | ORAL | 5 refills | Status: DC

## 2020-11-12 NOTE — Telephone Encounter (Signed)
Patient is scheduled for 12/03/20 at 9 am for labs

## 2020-11-12 NOTE — Telephone Encounter (Signed)
I called and spoke wit this patient. She started a period on 11/10/2020. She took a home pregnancy test which was negative. Will start letrozole 7.5 mg today. Cycle day 3 with first day of bleeding 11/10/2020. Rx for letrozole sent.   Please call and schedule her for a lab visit on 12/03/2020 for a progesterone test. Thank you.

## 2020-11-23 DIAGNOSIS — F331 Major depressive disorder, recurrent, moderate: Secondary | ICD-10-CM | POA: Diagnosis not present

## 2020-12-03 ENCOUNTER — Other Ambulatory Visit: Payer: Self-pay

## 2020-12-03 ENCOUNTER — Other Ambulatory Visit: Payer: BC Managed Care – PPO

## 2020-12-03 DIAGNOSIS — N839 Noninflammatory disorder of ovary, fallopian tube and broad ligament, unspecified: Secondary | ICD-10-CM

## 2020-12-04 LAB — PROGESTERONE: Progesterone: 18.4 ng/mL

## 2020-12-19 ENCOUNTER — Telehealth: Payer: Self-pay

## 2020-12-19 NOTE — Telephone Encounter (Signed)
Pt calling; on letrazole tx; is 5-6wks preg; had a light brown d/t yesterday; today it is bright red and she has a little bit of upset stomach.  [phone]  Pt states they did have IC within 24-48hrs before d/c started; adv probably from that; to monitor and if becomes like a period to be seen; pt states it is nothing like that.

## 2020-12-20 NOTE — Telephone Encounter (Signed)
Patient is scheduled with [facility] for 10:30 12/21/20

## 2020-12-21 ENCOUNTER — Encounter: Payer: Self-pay | Admitting: Obstetrics

## 2020-12-21 ENCOUNTER — Other Ambulatory Visit: Payer: Self-pay

## 2020-12-21 ENCOUNTER — Ambulatory Visit (INDEPENDENT_AMBULATORY_CARE_PROVIDER_SITE_OTHER): Payer: BC Managed Care – PPO | Admitting: Obstetrics

## 2020-12-21 VITALS — BP 114/70 | Ht 64.0 in | Wt 154.4 lb

## 2020-12-21 DIAGNOSIS — O209 Hemorrhage in early pregnancy, unspecified: Secondary | ICD-10-CM

## 2020-12-21 DIAGNOSIS — N926 Irregular menstruation, unspecified: Secondary | ICD-10-CM | POA: Diagnosis not present

## 2020-12-21 LAB — POCT URINE PREGNANCY: Preg Test, Ur: POSITIVE — AB

## 2020-12-21 NOTE — Progress Notes (Signed)
Obstetrics & Gynecology Office Visit   Chief Complaint:  Chief Complaint  Patient presents with   Gynecologic Exam    History of Present Illness: Angela Morrow is patient of Dr. Bjorn Pippin who presents with a positive pregnancy (home UPTs) and the onset of vaginal bleeding several days ago. She has done a number of home tests. After IC on Monday (4 days ago) she had some dark brown spotting. The next day she started tosee red bleeding with some granular sediment type discharge as well. She continues to have vaginal bleeding today as well. She reached out to [facility] on My Chart, and made an appt to be seen today.    Review of Systems:  Review of Systems  Constitutional: Negative.   HENT: Negative.    Eyes: Negative.   Respiratory: Negative.    Cardiovascular: Negative.   Gastrointestinal: Negative.   Genitourinary: Negative.   Skin: Negative.   Neurological: Negative.   Endo/Heme/Allergies: Negative.   Psychiatric/Behavioral: Negative.      Past Medical History:  Past Medical History:  Diagnosis Date   Anxiety    Depression    PTSD (post-traumatic stress disorder)     Past Surgical History:  No past surgical history on file.  Gynecologic History: Patient's last menstrual period was 11/10/2020.  Obstetric History: G2P1001  Family History:  Family History  Problem Relation Age of Onset   Bladder Cancer Maternal Grandfather 23    Social History:  Social History   Socioeconomic History   Marital status: Married    Spouse name: Not on file   Number of children: Not on file   Years of education: Not on file   Highest education level: Not on file  Occupational History   Not on file  Tobacco Use   Smoking status: Never   Smokeless tobacco: Never  Vaping Use   Vaping Use: Never used  Substance and Sexual Activity   Alcohol use: Yes    Comment: Social   Drug use: Never   Sexual activity: Yes    Birth control/protection: None    Comment: Paragard 2018  Other  Topics Concern   Not on file  Social History Narrative   Not on file   Social Determinants of Health   Financial Resource Strain: Not on file  Food Insecurity: Not on file  Transportation Needs: Not on file  Physical Activity: Not on file  Stress: Not on file  Social Connections: Not on file  Intimate Partner Violence: Not on file    Allergies:  Allergies  Allergen Reactions   Bactrim [Sulfamethoxazole-Trimethoprim] Hives   Menthol     Medications: Prior to Admission medications   Medication Sig Start Date End Date Taking? Authorizing Provider  DEPAKOTE 125 MG DR tablet  02/14/19   [provider]  EFFEXOR XR 37.5 MG 24 hr capsule  02/14/19   [provider]  gabapentin (NEURONTIN) 100 MG capsule Take 200 mg by mouth 2 (two) times daily. Patient not taking: Reported on 08/06/2020    [provider]  KLONOPIN 0.5 MG tablet Take 0.25 mg by mouth 2 (two) times daily as needed. 04/05/20   [provider]  lamoTRIgine (LAMICTAL) 25 MG tablet Take by mouth. Patient not taking: Reported on 09/24/2020 05/16/20   [provider]  LATUDA 20 MG TABS tablet take 2 tablets for one week Patient not taking: Reported on 09/24/2020 08/01/20   [provider]  letrozole (FEMARA) 2.5 MG tablet Take 3 tablets (7.5 mg total)  by mouth daily. Take on days 3 to 7 following a spontaneous menses or progestin-induced bleed. 11/12/20   Natale Milch, MD  Lumateperone Tosylate 42 MG CAPS Take by mouth. 08/20/20   [provider]  medroxyPROGESTERone (PROVERA) 10 MG tablet Take 1 tablet (10 mg total) by mouth daily. Use for ten days 09/24/20   Natale Milch, MD  propranolol (INDERAL) 10 MG tablet Take 10 mg by mouth 2 (two) times daily. 05/14/20   [provider]  traZODone (DESYREL) 50 MG tablet  06/24/19   [provider]    Physical Exam Vitals:  Vitals:   12/21/20 1019  BP: 114/70   Patient's last menstrual  period was 11/10/2020.  Physical Exam Constitutional:      Appearance: Normal appearance. She is normal weight.  HENT:     Head: Normocephalic and atraumatic.  Cardiovascular:     Rate and Rhythm: Normal rate and regular rhythm.  Pulmonary:     Effort: Pulmonary effort is normal.     Breath sounds: Normal breath sounds.  Abdominal:     General: Abdomen is flat.     Palpations: Abdomen is soft.  Genitourinary:    Comments: Pelvic exam: norm al ext genitalia Uterus is slightly enlarged, anteverted and midline. Moderate amount of dark red bllo seen in the vault. Musculoskeletal:        General: Normal range of motion.     Cervical back: Normal range of motion and neck supple.  Skin:    General: Skin is warm and dry.  Neurological:     General: No focal deficit present.     Mental Status: She is alert and oriented to person, place, and time.  Psychiatric:        Mood and Affect: Mood normal.        Behavior: Behavior normal.     Assessment: 25 y.o. G2P1001  with + pregnancy tests and vaginal bleeding in early pregnancy. Threatened miscarriage.   Plan: Problem List Items Addressed This Visit       Other   Missed period   Relevant Orders   POCT urine pregnancy (Completed)   Beta hCG quant (ref lab)   Bleeding in early pregnancy - Primary   Relevant Orders   Beta hCG quant (ref lab)   Discussed with [facility] to confirm POC. Quant drawn today, and she will return in 4 days for a repat quant and visit with MD. Will order sono based on today's quant results. Discussed the various reasons for early pregnancy bleedign with her today, including possible SAB, implantation bleeding, post coital,etc.  Mirna Mires, CNM  12/21/2020 11:14 AM

## 2020-12-22 LAB — BETA HCG QUANT (REF LAB): hCG Quant: 139 m[IU]/mL

## 2020-12-25 ENCOUNTER — Encounter: Payer: Self-pay | Admitting: Obstetrics and Gynecology

## 2020-12-25 ENCOUNTER — Ambulatory Visit (INDEPENDENT_AMBULATORY_CARE_PROVIDER_SITE_OTHER): Payer: BC Managed Care – PPO | Admitting: Obstetrics and Gynecology

## 2020-12-25 ENCOUNTER — Other Ambulatory Visit: Payer: Self-pay

## 2020-12-25 VITALS — BP 100/70 | Ht 64.0 in | Wt 155.2 lb

## 2020-12-25 DIAGNOSIS — O209 Hemorrhage in early pregnancy, unspecified: Secondary | ICD-10-CM | POA: Diagnosis not present

## 2020-12-25 DIAGNOSIS — O200 Threatened abortion: Secondary | ICD-10-CM | POA: Diagnosis not present

## 2020-12-25 NOTE — Progress Notes (Signed)
Patient ID: Angela Morrow, female   DOB: 20-Feb-1996, 25 y.o.   MRN: [id number]  Reason for Consult: Gynecologic Exam   Referred by No ref. provider found  Subjective:     HPI:  Angela Morrow is a 25 y.o. female she presents today for follow-up regarding bleeding in early pregnancy.  She reports that she has been taking home pregnancy test and the darkness of the line has been going down.  She reports that yesterday she passed a large clot and material that seemed consistent with a miscarriage.  She reports that today her bleeding is very light and not feeling a pad.  She is having small clots that she is also passing.  She is otherwise feeling well.  She had beta-hCG testing on Friday last week which showed a level of 129.  Gynecological History  Patient's last menstrual period was 11/10/2020.  Past Medical History:  Diagnosis Date   Anxiety    Depression    PTSD (post-traumatic stress disorder)    Family History  Problem Relation Age of Onset   Bladder Cancer Maternal Grandfather 25   History reviewed. No pertinent surgical history.  Short Social History:  Social History   Tobacco Use   Smoking status: Never   Smokeless tobacco: Never  Substance Use Topics   Alcohol use: Yes    Comment: Social    Allergies  Allergen Reactions   Bactrim [Sulfamethoxazole-Trimethoprim] Hives   Menthol     Current Outpatient Medications  Medication Sig Dispense Refill   EFFEXOR XR 37.5 MG 24 hr capsule      KLONOPIN 0.5 MG tablet Take 0.25 mg by mouth 2 (two) times daily as needed.     propranolol (INDERAL) 10 MG tablet Take 10 mg by mouth 2 (two) times daily.     traZODone (DESYREL) 50 MG tablet      DEPAKOTE 125 MG DR tablet  (Patient not taking: Reported on 08/06/2020)     gabapentin (NEURONTIN) 100 MG capsule Take 200 mg by mouth 2 (two) times daily. (Patient not taking: Reported on 08/06/2020)     lamoTRIgine (LAMICTAL) 25 MG tablet Take by mouth. (Patient not taking: Reported  on 09/24/2020)     LATUDA 20 MG TABS tablet take 2 tablets for one week (Patient not taking: Reported on 09/24/2020)     letrozole (FEMARA) 2.5 MG tablet Take 3 tablets (7.5 mg total) by mouth daily. Take on days 3 to 7 following a spontaneous menses or progestin-induced bleed. 15 tablet 5   Lumateperone Tosylate 42 MG CAPS Take by mouth.     medroxyPROGESTERone (PROVERA) 10 MG tablet Take 1 tablet (10 mg total) by mouth daily. Use for ten days 10 tablet 6   No current facility-administered medications for this visit.    Review of Systems  Constitutional: Negative for chills, fatigue, fever and unexpected weight change.  HENT: Negative for trouble swallowing.  Eyes: Negative for loss of vision.  Respiratory: Negative for cough, shortness of breath and wheezing.  Cardiovascular: Negative for chest pain, leg swelling, palpitations and syncope.  GI: Negative for abdominal pain, blood in stool, diarrhea, nausea and vomiting.  GU: Negative for difficulty urinating, dysuria, frequency and hematuria.  Musculoskeletal: Negative for back pain, leg pain and joint pain.  Skin: Negative for rash.  Neurological: Negative for dizziness, headaches, light-headedness, numbness and seizures.  Psychiatric: Negative for behavioral problem, confusion, depressed mood and sleep disturbance.       Objective:  Objective   Vitals:  12/25/20 1101  BP: 100/70  Weight: 155 lb 3.2 oz (70.4 kg)  Height: 5\' 4"  (1.626 m)   Body mass index is 26.64 kg/m.  Physical Exam Vitals and nursing note reviewed. Exam conducted with a chaperone present.  Constitutional:      Appearance: Normal appearance.  HENT:     Head: Normocephalic and atraumatic.  Eyes:     Extraocular Movements: Extraocular movements intact.     Pupils: Pupils are equal, round, and reactive to light.  Cardiovascular:     Rate and Rhythm: Normal rate and regular rhythm.  Pulmonary:     Effort: Pulmonary effort is normal.     Breath sounds:  Normal breath sounds.  Abdominal:     General: Abdomen is flat.     Palpations: Abdomen is soft.  Musculoskeletal:     Cervical back: Normal range of motion.  Skin:    General: Skin is warm and dry.  Neurological:     General: No focal deficit present.     Mental Status: She is alert and oriented to person, place, and time.  Psychiatric:        Behavior: Behavior normal.        Thought Content: Thought content normal.        Judgment: Judgment normal.    Assessment/Plan:     25 year old following up for bleeding in early pregnancy. Will check beta hCG today although it is likely that the patient is experiencing miscarriage.  Discussed that she can restart letrozole once the beta-hCG is less than 5.  If today's labs do not show a level less than 5 she will follow-up on Friday for repeat beta-hCG testing. Discussed that it would be expected for her bleeding to stop within the next week.  If for some reason her bleeding does not resolve we will follow with pelvic ultrasound. Condolences offered for this loss.  More than 20 minutes were spent face to face with the patient in the room, reviewing the medical record, labs and images, and coordinating care for the patient. The plan of management was discussed in detail and counseling was provided.     Thursday MD [facility], [facility] 12/25/2020 11:24 AM

## 2020-12-25 NOTE — Patient Instructions (Signed)
[facility]  MJ Tucci [phone]   Please accept our heartfelt condolences and call us if you need an ear to listen.  Web Resources: www.babycenter.com www.acog.org [email or web address] [email or web address] [email or web address] This site has an excellent pamphlet that you can download on early pregnancy loss.  Books: Miscarriage: Women Sharing from the Heart by Marie Allen and Shelly Marks. Published by [employer]. Summarizes reactions of 100 women who experience miscarriage.  Molly's [place] by Janice Cohn. Published by [employer]. Children's book on miscarriage.  Miscarriage: A Man's Book by Rick Wheat. Published by [employer] at [phone]. Written to help men understand reactions of both parents to miscarriage.  Miscarriage: A Shattered Dream by Sherokee Ilse and Linda Hammer Burns.  I Never Held You: A book about miscarriage, healing and recovery by Ellen M. DuBois.   MISCARRIAGE OR NEONATAL LOSS.a word to parents By Gretchen Gross, MSW  For those who experience a miscarriage or fetal loss, the world has turned upside down. Regardless of whether the pregnancy was planned and wanted, or unplanned and the parents were ambivalent, there are feelings of sadness, grief and a loss of equilibrium which effects both parents. Though we allow for these feelings when other deaths occur, we are remiss in acknowledging and allowing parents to grieve their pregnancy loss or miscarriage. We wrongly equate the depth of the loss to the size of the casket.  With pregnancy loss or death before birth, there is little guidance or expectation of what is normal grief. Others, who minimize or misunderstand the loss, may expect that you "get back to normal" quickly. With pressures to overlook the impact of the loss, healthy grieving processes can get stunted, overlooked, or stopped all together. I hope that this handout will  allow you to acknowledge and express whatever loss you feel, to share with others your sorrow and will encourage you and others to take a moment to understand and experience the depth of your emotions at this time.  A baby represents so much. It may represent hope, immortality, fulfillment of dreams, or an outward sign of a loving relationship. At the same time, pregnancy may bring on anxiety, fear, and an increase in commitment and responsibility. Understandably, few pregnant parents experience only one or the other of these emotions. Despite the type of emotional response to the pregnancy, as the process continues, what is central to almost all pregnancies is that over time, the bond between parent and child grows. Each day brings an expansion of the world. What was once routine now becomes more important. Diet, lifestyle, connection to family, time commitments, relationships with friends, spouse, others.nothing is as it was. These are the normal changes that a pregnant woman and her partner experience. When a baby dies, nothing that made sense before makes any sense after. We are left emotionally raw. Often our bodies respond in ways that assure us that we are crazy, but we are not. We are grieving a life that we never fully knew, a relationship that was too short. A part of us has died Too.  Mothers and fathers become more attached to a pregnancy as each day, week and month go by. Mothers have physical contact with the pregnancy, daily reminders of the growth and change occurring. They connect by talking to their bellies, by wondering what life will be like this time next year, look for clothes, cribs, pediatricians and more. Fathers often become attached in different ways: considering buying a new car or safe car   seat, working more to earn a little more money before the baby arrives, or by reconsidering the family's financial status. However bonding occurs, it generally increases as  the baby grows. For family members, co-workers and friends, the pregnancy may be less real. They may only know of the pregnancy from a physical or emotional distance. Since many of us are so uncomfortable with death and we work hard to deny it. With a prenatal loss, the lack of contact with the unborn baby may encourage others to minimize the loss, or even suggest that it was something to feel thankful for at this time suggesting that the pain felt now would be significantly less than at any other time in relationship to this child.  That is why it feels so crazy to be in such pain when others might not understand. You might feel that you are still standing at an emotional graveside and the world wonders "when you'll be ready to go back to work" or "why you still cry. It's been four months already." Be assured that it is the rest of the world that is, in this case, acting crazy. When a living child or adult dies, we have understandings, behaviors, rituals that though painful, enable us to feel cared for, comforted, acknowledged in our loss, and encourage healing. Unfortunately, too little of this is the case when there is a miscarriage or neonatal loss.  There are many difficult dilemmas that you might face in the wake of the loss. Do you have a funeral? How do you tell people? Do you publish an obituary? What do you do with the remains, both physical and emotional? I encourage people to find a formalized way to acknowledge the loss of the pregnancy and to honor the relationship that did and does still exist and will for the rest of your life. It is your choice as to whether this happens in a funeral service, in a memorial service, in a letter to the child who you did not meet or in some manner that makes sense to you religiously, spiritually or emotionally. It is not silly to ask friends or family to participate nor is it improper to keep this intensely personal and private. Grief, however,  needs to be a public process. We must grieve among others and with our loved ones. We do not all need to feel the same amount of pain in the loss, but we need to act as a loving community to support those most hurt at this time. Healthy grieving involves a strengthening of the bonds with others who we love and who love us. Some people ask a close friend or relative to contact others and inform them of the loss to minimize the telling of the story. Others find relief in telling friends, family and colleagues about the loss, in that it reminds us that the pregnancy was real and that this pain is real. Where one connection is broken, another is reinforced and strengthened. Grieving in isolation can be detrimental. By inviting important others to a funeral, tree planting, or sunset service and by taking them up on an offer to help will help your healing.  Some people have described aspects of grief after a pregnancy loss that are quite different from those present after the loss of an adult relative or friend. Some things that might seem "weird" or "abnormal" are indeed, quite normal following this kind of loss. G.W. Davidson described her experience in the book "Understanding: Death of the Wished-for Child." "  I kept searching for something. I wasn't sure what it was. All of a sudden one day in the kitchen, I spontaneously got out my kitchen scales and started weighing fruits and vegetables. I realized I was trying to find something that had the identical weight that the baby did.I found myself weighing my rolling pin.it happened to be the identical length and weight that the baby was." This describes the process of building memories. With miscarriage and neonatal loss, there are too few memories of the pregnancy or the baby itself. Parents may not ever be able to see or hold a baby in these cases, especially in the earlier trimesters, so saving mementos and gathering and providing information  are especially important. G.W. Davidson described a parent who needs to see and feel something that had similar dimensions as the child. This is profoundly different than the grieving that happens when a 25 year old person dies, when we have years of memories, concrete possessions, photos and history. Though it is a different aspect of grief, it is normal, expected and healthy. Your grief will not make you crazy.  Also, do not be frightened if you feel this loss in a very physical manner. Your arms or chest may ache, convincing you that your heart has broken. Women have described this as a feeling of "empty arms". They also describe a similar feeling of an emptiness in their bellies, which was previously full. Some women also swear that they still feel the baby moving in their bellies. Many people report being woken to the sound of a crying baby, having vivid dreams about babies, trying to find their lost babies, or graphic dreams of injuries happening to themselves or others. Breasts may still feel as if they are filling and it may seem as if the body is unaware of the loss. These things can feel quite confusing. If you experience any of these responses, please tell someone about them. They are not a sign of insanity, they are aspects of grief. Keeping them to yourself increases the feelings of isolation and disequilibrium. Be reassured that these are normal experiences after the loss of a pregnancy.   A WORD ABOUT DIFFERENCES BETWEEN MEN'S AND WOMEN'S GRIEF  If most people do not fully understand the pain of a mother's grief through pregnancy loss, there is an even greater denial on society's part that the father is in pain. I have seen men in great pain and despair after the loss of a pregnancy and they say that nobody has ever asked them about their loss. People may only ask about their wife/partner's pain. To put it simply, one father, head bowed, shoulders heaving as he cried said  "if one more person asks me how Jane is, I'll fly apart. Don't they know I lost a child too? Don't they see the circles under my eyes, my face, my pain? Don't I count?" Couples often move through the grief process in different ways and at different times. Do not assume that your partner is better because they are wanting to take in a movie or go out to eat. This may just be a diversion, a need for a change of scenery, and a yearning for normalcy. Women are usually more comfortable crying and men find safety in action. For this reason, friction can develop between partners, when one wants the other to start behaving like they did before the loss or to cry along with them. Respect one another's process. Talk to your partner about   the specifics. If asked, "How are you today?" many people will just say "fine". Specifically relate that you have had a hard time with the holiday, or seeing a friend's new baby, and ask your partner how they felt in that moment. Sex is very often a very difficult prospect after a miscarriage or loss. It might remind a person of the conception, raise fears of another pregnancy and future children, or seem like too much pleasure to experience when one is otherwise in pain. Many men reconnect with their partner by being sexual and feel increasingly isolated when a partner does not respond. Talk. There may be a comfortable middle ground with each other. If the general patterns of the relationship shift toward uncomfortable dynamics, such as decreased communication, blaming one another, or increased absences from the home, it is important to seek counseling with a professional. This is not an easy time, you have had a loss. Be patient with yourself. Seek help and support from loved ones, friends, professionals. The grief will evolve with time and you will not always be in such immediate or raw pain. Be assured that you may never forget your pregnancy or your baby. Healing  takes time. I have compiled a list of books on grief and death that might be useful.  *Anna: A Daughter's Life. William Loizeaux, [employer], 1993. A father's account of his grief at the loss of his infant daughter. *Explaining Death to Children. Earl Grollman, [employer], 1967. A valuable guide to help adults/parents explain death to children. *When Pregnancy Fails: Families Coping with Miscarriage, Ectopic Pregnancy, Stillbirth, and Infant Death. Susan Borg and Judith Lasker, [employer], 1989. A good resource to understand pregnancy loss, including some information on support for the family. *Hour of Gold, Hour of Lead: Diaries and Letters of Anne Morrow Lindbergh. H[employer]ourt Brace Jovanovich, 1973. Anne Lindbergh writes of the pain and loss in the wake of their young child's abduction. *A Child Dies: A Portrait of Family Grief. Joan Hagan Arnold and Penelope Bushman Gemma, the [employer], 1995. Dealing with death of unborn children, infants, and children of all ages. Revised by Nancy J. McClellan, CNM, MS November, 2008    COPING WITH A MISCARRIAGE Susan Donnis, R.N., M.A.T.  You have had a miscarriage. You have lost a pregnancy. This is undoubtedly a difficult and sad time for you. Your loss may be hard to believe and it is frustrating to know you are helpless to change anything. "This can't possibly be happening to me. Why me? Why not someone else" I promise I'll be more careful if I can only have my baby back." These are common reactions to experiencing a miscarriage. Feelings of disbelief and anger, helplessness and guilt, depression and perhaps failure, are real and understandable. You may tell yourself that the guilt you feel is irrational, unreasonable or inappropriate, but it is there. "What did I do to cause this? Why wasn't I more careful? If only I had known. Is this a punishment for something I did?" You may find yourself recounting the days or weeks  before your miscarriage, searching for clues that you feel sure you must have missed; searching for valid reasons for how or why this happened. Having something "taken away" that you cherish feels shocking and unbelievable. You ask your doctor or midwife for an explanation; friends volunteer their opinions. In many cases, your questions of "how" or "why" are not satisfactorily answered. You may have some of the above thoughts or feelings and they   may be present in different combinations, differing degrees, and in some confusion. They are understandable and part of the process of beginning to cope with a significant loss. With any death, it is healthy and essential to allow yourself to experience grief whether you are a man or woman. The grieving process is not something begun and completed in a day, a week or a month. It takes time, understanding, support and acceptance. In the case of a miscarriage, this process is often more difficult because the death is not publicly acknowledged. There is no funeral to arrange and attend, no outward recognition of a loss. This may tend to increase feelings of isolation and loneliness. The term miscarriage is used here instead of the more medically correct term spontaneous abortion to avoid confusion with elective abortion. Some couples feel they must keep their grief invisible and "get over this quickly." Most employers will freely give time off to attend a family funeral, but many do not understand the reasonable and justified request for time off after a miscarriage. Loss of a longed-for pregnancy, regardless of how early, is a significant bereavement. Men and women may react to the miscarriage differently. Men may feel more helpless and frustrated than their wives because they often assume a passive-observer role. Waiting and watching can be harder than actively participating, regardless of the outcome. Men do experience a miscarriage in spite of not  actively participating in the physical loss. People have different ways of coping with a loss and you need to choose what is most helpful to you. Too often "being strong" is looked upon as a healthy way of coping, when, in fact, repressing and ignoring very real feelings is not helpful and contributes to serious coping problems. Some feelings and questions that men and women express and ask are:  It hangs over me like a black cloud. Though I've tried, I honestly don't know how to work this through. How do I say goodbye?  I feel extremely guilty. I guess I really didn't want this pregnancy. I feel relieved that it's over, but so guilty because I'm relieved. This isn't right.  I'm fine. Life must go on and I'm a strong person. There's really nothing to grieve for anyway, is there?  I feel pressure not to be sad. So I tend to cover up a lot.  How do I respond to well meaning but frustrating platitudes like, "Try again soon, dear. Another pregnancy will help." I would like to be pregnant again, but I believe a pregnancy should happen at a time of strength, not sadness.  How do I respond to silence and awkward attempts to avoid the whole issue? (You may be the one that decides to bring up the topic. Friends, relatives and acquaintances often do not know how to respond, so they say nothing).  I'm fine until my period comes. It is such a start reminder of the part of me that is lost.  My husband/wife and I always enjoyed a good sex life. Since my miscarriage, I have a hard time allowing myself to feel loving and sensuous. After all if such a loving and pleasurable experience as intercourse started something that ended in such a tragedy, it's too scary to think it might happen again. Why doesn't my husband/wife understand?  I have to face reality. Should I just force myself to attend my friend's baby shower?  Everyone was so supportive and caring when I was in the hospital. That was four months  ago.   I can't burden others with my sadness now. Why am I still sad? I should be over this thing by now. Knowing common facts about miscarriage probably will not blot out your negative feelings - denial, anger, disappointment, sadness - but facts can hold some comfort and can help cushion the sadness and guilt. For those wanting a child, it is frustrating and intensely disappointing to experience any miscarriage. However, most pregnancies that end in the first three months are imperfect in some way and, regardless of any precaution or intervention, are incapable of surviving and growing beyond approximately 14 weeks. This fact may not ease your sense of emptiness and sadness, but it may help ease your guilt and sense of assumed responsibility. Knowing clearly that you did not cause this to happen, that you are not directly responsible of your miscarriage by what you did or did not do, can bring a sense of relief and relaxation to your anxieties or guilt. A pregnancy that ends in later months is uniquely difficult also. Your protruding abdomen, your child's movements and heartbeat, are concrete proof of your expectant parenthood. A miscarriage occurring at 6 weeks or at 6 months causes a sudden change of events and feelings. Understandably, you may feel robbed or cheated of a promise of what was to be. It's easy to think that everyone else can have children. Surely I've been tricked or badly treated, you think. A fact not commonly know is that at least one in five pregnancies end in miscarriage, which indicates you are not alone. In a room of 25 couples, the odds are that 4 have experienced a miscarriage. Of these 8 people, many have shared your experience and feelings. Of course, this is not an easy topic to talk about, therefore it can appear that miscarriages rarely happen and that you are the only one coping with this loss. Realizing you are note alone can perhaps ease some of the shock and  disbelief of "WHY Me?"  Three actions will help: 1. Giver yourself permission to be sad. It is possible to be sad without becoming chronically depressed. Set limits for yourself if you are concerned about becoming overly depressed. If the sadness begins to wash over you while at work at 2 p.m., tell yourself that you cannot deal with it then, but make an agreement with yourself that at 8 p.m. that night you will. And then do it. Many individuals report a beginning sense of control over their grief when they realize they can create appropriate and private times to be sad, alone, or with a significant other person. If you think you may be severely depressed (i.e. having difficulty getting out of bed in the morning, withdrawal from friends and relatives, insomnia, extended loss of appetite or overeating, loss of the ability to enjoy anything), this problem is probably not something you can handle yourself. It is important to seek out professional help at this time, or at any time when you are feeling particularly discouraged about your Coping.  2. Find a supportive, trusted person and talk about your feelings and your questions. Build a support system, whether it be your spouse, partner, friend, relative, colleague, clergyman, support group or professional counselor. You may still feel some pangs of sadness when attending a baby shower, celebrating a holiday, seeing other pregnant couples or passing the date of your expected delivery, but once you work through your loss, coping with your situation will become easier.  3. Also part of the process of life   itself, is understanding and accepting incongruent or seemingly contradictory feelings. As one insightful women expressed, "Being happy for a friend who has just had a baby (or twins!) is a genuine feeling, but also resentment, anger and frustration are there, all wrapped into one package;" two apparently conflicting feelings, but both can  exist, be recognized, and accepted.  A miscarriage is an unexpected, often unexplained, death; with any loss you need to give yourself permission to grieve, to be angry, sad or relieved. There is no one appropriate reaction. Acknowledging your feelings that are real and understandable, though they may not be logical or rational, is part of the grieving process. Allowing the grieving process to happen is a positive way of coping and leads to health resolve and strength to look ahead. There may be times when you may not be able to put words to your feelings, but that does not need to stop you from seeking out and talking with an understanding person. Building your own support system will help you regain confidence and strength within yourself.  RECOMMENDED READING The following materials are all available through [employer], [address], [place] [address]; phone [phone]. For Adults: Empty Arms by Sherokee Ilse Empty Cradle, Broken Heart by Deborah Davis Ended Beginnings by C. Panuthos & C. Romeo Miscarriage a [employer] Resource Miscarriage - A Shattered Dream by Sherokee Ilse Newborn Death a [employer] Resource Still To Be Born by Perinatal Loss When a Baby Dies by M.J. Church et al (TCF) When Hello Means Goodbye by P. Schwiebert and P. Kirk When Pregnancy Fails by S. Borg & J. Lasker For Helping Other Children How Do We Tell the Children? by Shaefer & Lyons No New Baby by Marilyn Gryte Talking About Death by Earl Grollman Where's Jess?? A [employer] Resource Other helpful materials available locally at bookstores: The Fall of Freddy the Leaf by Leo Buscaglia, PhD, [employer] (for children) When Goodbye is Forever - Learning to Live Again After the Loss of a Child by John Bramblett, [employer]   

## 2020-12-25 NOTE — Progress Notes (Signed)
Attempted to contact patient by phone regarding her first [facility]. She has an appointment for follow up today. Left a phone number for her to call with any questions. Mirna Mires, CNM  12/25/2020 9:59 AM

## 2020-12-26 LAB — BETA HCG QUANT (REF LAB): hCG Quant: 22 m[IU]/mL

## 2020-12-28 ENCOUNTER — Other Ambulatory Visit: Payer: Self-pay

## 2020-12-28 ENCOUNTER — Other Ambulatory Visit: Payer: BC Managed Care – PPO

## 2020-12-28 DIAGNOSIS — F331 Major depressive disorder, recurrent, moderate: Secondary | ICD-10-CM | POA: Diagnosis not present

## 2020-12-28 DIAGNOSIS — O209 Hemorrhage in early pregnancy, unspecified: Secondary | ICD-10-CM

## 2021-01-09 ENCOUNTER — Encounter: Payer: BC Managed Care – PPO | Admitting: Obstetrics and Gynecology

## 2021-01-18 DIAGNOSIS — F331 Major depressive disorder, recurrent, moderate: Secondary | ICD-10-CM | POA: Diagnosis not present

## 2021-01-19 LAB — BETA HCG QUANT (REF LAB)

## 2021-02-08 DIAGNOSIS — F331 Major depressive disorder, recurrent, moderate: Secondary | ICD-10-CM | POA: Diagnosis not present

## 2021-03-01 DIAGNOSIS — F331 Major depressive disorder, recurrent, moderate: Secondary | ICD-10-CM | POA: Diagnosis not present

## 2021-03-12 ENCOUNTER — Other Ambulatory Visit: Payer: Self-pay | Admitting: Obstetrics and Gynecology

## 2021-03-12 ENCOUNTER — Telehealth: Payer: Self-pay

## 2021-03-12 DIAGNOSIS — Z3490 Encounter for supervision of normal pregnancy, unspecified, unspecified trimester: Secondary | ICD-10-CM

## 2021-03-12 NOTE — Telephone Encounter (Signed)
Contacted patient via phone. Phone not accepting calls.

## 2021-03-12 NOTE — Telephone Encounter (Signed)
-----   Message from Natale Milch, MD sent at 03/12/2021 10:17 AM EDT ----- Please make this patient a lab appointment for today or tomorrow- thank you

## 2021-03-12 NOTE — Telephone Encounter (Signed)
Patient is scheduled for 03/13/21. 

## 2021-03-13 ENCOUNTER — Other Ambulatory Visit: Payer: Self-pay

## 2021-03-13 ENCOUNTER — Other Ambulatory Visit: Payer: BC Managed Care – PPO

## 2021-03-13 DIAGNOSIS — Z3490 Encounter for supervision of normal pregnancy, unspecified, unspecified trimester: Secondary | ICD-10-CM

## 2021-03-14 ENCOUNTER — Other Ambulatory Visit: Payer: Self-pay | Admitting: Obstetrics and Gynecology

## 2021-03-14 ENCOUNTER — Telehealth: Payer: Self-pay

## 2021-03-14 DIAGNOSIS — Z3490 Encounter for supervision of normal pregnancy, unspecified, unspecified trimester: Secondary | ICD-10-CM

## 2021-03-14 DIAGNOSIS — N839 Noninflammatory disorder of ovary, fallopian tube and broad ligament, unspecified: Secondary | ICD-10-CM

## 2021-03-14 LAB — PROGESTERONE: Progesterone: 0.6 ng/mL

## 2021-03-14 LAB — BETA HCG QUANT (REF LAB): hCG Quant: 78 m[IU]/mL

## 2021-03-14 MED ORDER — PROGESTERONE 200 MG VA SUPP: 200.0000 mg | Freq: Every day | VAGINAL | 3 refills | Status: DC

## 2021-03-14 MED ORDER — PROGESTERONE 200 MG PO CAPS: 200.0000 mg | ORAL_CAPSULE | Freq: Every evening | ORAL | 4 refills | Status: AC

## 2021-03-14 NOTE — Telephone Encounter (Signed)
Pt calling; had blood work done yesterday; has seen results in MyChart but hasn't heard from [facility] about them; is really concerned b/c progesterone is really low and she is preg; also HCG is supposed to be drawn again tomorrow and hasn't heard anything about that either.  [phone] courtesy call to pt - [facility] in OR and on call today; will send her msg to [facility].

## 2021-03-14 NOTE — Telephone Encounter (Signed)
Called and followed with patient.

## 2021-03-15 ENCOUNTER — Other Ambulatory Visit: Payer: Self-pay

## 2021-03-15 ENCOUNTER — Telehealth: Payer: Self-pay

## 2021-03-15 ENCOUNTER — Other Ambulatory Visit: Payer: BC Managed Care – PPO

## 2021-03-15 DIAGNOSIS — Z3490 Encounter for supervision of normal pregnancy, unspecified, unspecified trimester: Secondary | ICD-10-CM

## 2021-03-15 NOTE — Telephone Encounter (Signed)
Called and left voicemail for patient to call back to be scheduled. 

## 2021-03-15 NOTE — Telephone Encounter (Signed)
-----   Message from Natale Milch, MD sent at 03/14/2021  6:42 PM EDT ----- [place] Dec,  Can you please schedule Tresa Endo for a lab visit on 03/15/2021 (Friday) ?  Thank you,  Dr. Jerene Pitch

## 2021-03-16 LAB — BETA HCG QUANT (REF LAB): hCG Quant: 63 m[IU]/mL

## 2021-03-18 NOTE — Telephone Encounter (Signed)
Patient was scheduled for 03/15/21 for labs

## 2021-03-22 DIAGNOSIS — F331 Major depressive disorder, recurrent, moderate: Secondary | ICD-10-CM | POA: Diagnosis not present

## 2021-03-29 ENCOUNTER — Encounter: Payer: BC Managed Care – PPO | Admitting: Obstetrics and Gynecology

## 2021-04-01 ENCOUNTER — Encounter: Payer: Self-pay | Admitting: Obstetrics and Gynecology

## 2021-04-06 NOTE — Telephone Encounter (Signed)
Called and discussed with patient. Will plan to work her in on 11/15 for a visit.

## 2021-04-09 ENCOUNTER — Encounter: Payer: Self-pay | Admitting: Obstetrics and Gynecology

## 2021-04-09 ENCOUNTER — Other Ambulatory Visit: Payer: Self-pay

## 2021-04-09 ENCOUNTER — Ambulatory Visit (INDEPENDENT_AMBULATORY_CARE_PROVIDER_SITE_OTHER): Payer: BC Managed Care – PPO | Admitting: Obstetrics and Gynecology

## 2021-04-09 VITALS — BP 120/80 | Ht 64.0 in | Wt 173.0 lb

## 2021-04-09 DIAGNOSIS — Z23 Encounter for immunization: Secondary | ICD-10-CM | POA: Diagnosis not present

## 2021-04-09 DIAGNOSIS — O0281 Inappropriate change in quantitative human chorionic gonadotropin (hCG) in early pregnancy: Secondary | ICD-10-CM

## 2021-04-09 NOTE — Progress Notes (Signed)
Patient ID: Angela Morrow, female   DOB: July 23, 1995, 25 y.o.   MRN: [id number]  Reason for Consult: Follow-up   Referred by No ref. provider found  Subjective:     HPI:  Angela Morrow is a 25 y.o. 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  She reports that she has 5 days of light bleeding.  She is concerned that she could have retained products of conception.  She is taking home pregnancy test which she reports are now negative.  Gynecological History  Patient's last menstrual period was 11/10/2020.  Past Medical History:  Diagnosis Date   Anxiety    Depression    PTSD (post-traumatic stress disorder)    Family History  Problem Relation Age of Onset   Bladder Cancer Maternal Grandfather 25   History reviewed. No pertinent surgical history.  Short Social History:  Social History   Tobacco Use   Smoking status: Never   Smokeless tobacco: Never  Substance Use Topics   Alcohol use: Yes    Comment: Social    Allergies  Allergen Reactions   Bactrim [Sulfamethoxazole-Trimethoprim] Hives   Menthol     Current Outpatient Medications  Medication Sig Dispense Refill   ARIPiprazole (ABILIFY) 2 MG tablet TAKE 1 TABLET(2 MG) BY MOUTH DAILY     KLONOPIN 0.5 MG tablet Take 0.25 mg by mouth 2 (two) times daily as needed.     letrozole (FEMARA) 2.5 MG tablet Take by mouth.     mirtazapine (REMERON) 7.5 MG tablet Take 7.5 mg by mouth at bedtime.     progesterone (PROMETRIUM) 200 MG capsule Take 1 capsule (200 mg total) by mouth at bedtime. Place into the vagina before bedtime 30 capsule 4   propranolol (INDERAL) 10 MG tablet Take 10 mg by mouth 2 (two) times daily.     EFFEXOR XR 37.5 MG 24 hr capsule      LATUDA 20 MG TABS tablet take 2 tablets for one week (Patient not taking: Reported on 09/24/2020)     traZODone (DESYREL) 50 MG tablet      No current facility-administered medications for this visit.    Review of Systems  Constitutional: Negative for  chills, fatigue, fever and unexpected weight change.  HENT: Negative for trouble swallowing.  Eyes: Negative for loss of vision.  Respiratory: Negative for cough, shortness of breath and wheezing.  Cardiovascular: Negative for chest pain, leg swelling, palpitations and syncope.  GI: Negative for abdominal pain, blood in stool, diarrhea, nausea and vomiting.  GU: Negative for difficulty urinating, dysuria, frequency and hematuria.  Musculoskeletal: Negative for back pain, leg pain and joint pain.  Skin: Negative for rash.  Neurological: Negative for dizziness, headaches, light-headedness, numbness and seizures.  Psychiatric: Negative for behavioral problem, confusion, depressed mood and sleep disturbance.       Objective:  Objective   Vitals:   04/09/21 1130  BP: 120/80  Weight: 173 lb (78.5 kg)  Height: 5\' 4"  (1.626 m)   Body mass index is 29.7 kg/m.  Physical Exam Vitals and nursing note reviewed. Exam conducted with a chaperone present.  Constitutional:      Appearance: Normal appearance.  HENT:     Head: Normocephalic and atraumatic.  Eyes:     Extraocular Movements: Extraocular movements intact.     Pupils: Pupils are equal, round, and reactive to light.  Cardiovascular:     Rate and Rhythm: Normal rate and regular rhythm.  Pulmonary:     Effort: Pulmonary effort  is normal.     Breath sounds: Normal breath sounds.  Abdominal:     General: Abdomen is flat.     Palpations: Abdomen is soft.  Musculoskeletal:     Cervical back: Normal range of motion.  Skin:    General: Skin is warm and dry.  Neurological:     General: No focal deficit present.     Mental Status: She is alert and oriented to person, place, and time.  Psychiatric:        Behavior: Behavior normal.        Thought Content: Thought content normal.        Judgment: Judgment normal.    Assessment/Plan:     25 year old [id number] following up after chemical pregnancy. Transvaginal pelvic ultrasound  performed today at bedside.  Endometrial thickness measures 6 mm with no increased color flow surrounding the endometrial lining.  No concern at this time for retained products of conception.  Offered patient beta-hCG here today in office but she declines. Discussed next steps in her care.  Patient currently taking home ovulation test.  She reports she is cycle day 12 and has not ovulated.  She has missed her window for initiation of letrozole cycle.  She has a plan to follow-up with infertility specialist on the 30th of this month.  Recommended discussing next steps with them as she transfers care for infertility to their practice.  Flu shot given today.  More than 20 minutes were spent face to face with the patient in the room, reviewing the medical record, labs and images, and coordinating care for the patient. The plan of management was discussed in detail and counseling was provided.      Adelene Idler MD [facility], [facility] 04/09/2021 12:08 PM

## 2021-04-12 DIAGNOSIS — F331 Major depressive disorder, recurrent, moderate: Secondary | ICD-10-CM | POA: Diagnosis not present

## 2021-04-24 DIAGNOSIS — Z3169 Encounter for other general counseling and advice on procreation: Secondary | ICD-10-CM | POA: Diagnosis not present

## 2021-05-10 DIAGNOSIS — F331 Major depressive disorder, recurrent, moderate: Secondary | ICD-10-CM | POA: Diagnosis not present

## 2021-06-06 DIAGNOSIS — F331 Major depressive disorder, recurrent, moderate: Secondary | ICD-10-CM | POA: Diagnosis not present

## 2021-06-21 DIAGNOSIS — Z3169 Encounter for other general counseling and advice on procreation: Secondary | ICD-10-CM | POA: Diagnosis not present

## 2021-07-19 DIAGNOSIS — F331 Major depressive disorder, recurrent, moderate: Secondary | ICD-10-CM | POA: Diagnosis not present

## 2021-07-22 DIAGNOSIS — Z3169 Encounter for other general counseling and advice on procreation: Secondary | ICD-10-CM | POA: Diagnosis not present

## 2021-07-26 DIAGNOSIS — Z3189 Encounter for other procreative management: Secondary | ICD-10-CM | POA: Diagnosis not present

## 2021-08-12 DIAGNOSIS — F331 Major depressive disorder, recurrent, moderate: Secondary | ICD-10-CM | POA: Diagnosis not present

## 2021-08-13 DIAGNOSIS — Z3169 Encounter for other general counseling and advice on procreation: Secondary | ICD-10-CM | POA: Diagnosis not present

## 2021-08-20 DIAGNOSIS — Z3189 Encounter for other procreative management: Secondary | ICD-10-CM | POA: Diagnosis not present

## 2021-08-23 DIAGNOSIS — Z3189 Encounter for other procreative management: Secondary | ICD-10-CM | POA: Diagnosis not present

## 2021-08-27 DIAGNOSIS — Z3189 Encounter for other procreative management: Secondary | ICD-10-CM | POA: Diagnosis not present

## 2021-09-09 DIAGNOSIS — F331 Major depressive disorder, recurrent, moderate: Secondary | ICD-10-CM | POA: Diagnosis not present

## 2021-09-12 DIAGNOSIS — Z3189 Encounter for other procreative management: Secondary | ICD-10-CM | POA: Diagnosis not present

## 2021-09-19 DIAGNOSIS — Z3189 Encounter for other procreative management: Secondary | ICD-10-CM | POA: Diagnosis not present

## 2021-09-23 DIAGNOSIS — Z3189 Encounter for other procreative management: Secondary | ICD-10-CM | POA: Diagnosis not present

## 2021-09-25 DIAGNOSIS — Z3189 Encounter for other procreative management: Secondary | ICD-10-CM | POA: Diagnosis not present

## 2021-09-30 DIAGNOSIS — F331 Major depressive disorder, recurrent, moderate: Secondary | ICD-10-CM | POA: Diagnosis not present

## 2021-10-08 DIAGNOSIS — Z3200 Encounter for pregnancy test, result unknown: Secondary | ICD-10-CM | POA: Diagnosis not present

## 2021-10-09 DIAGNOSIS — Z3200 Encounter for pregnancy test, result unknown: Secondary | ICD-10-CM | POA: Diagnosis not present

## 2021-10-14 DIAGNOSIS — F331 Major depressive disorder, recurrent, moderate: Secondary | ICD-10-CM | POA: Diagnosis not present

## 2022-08-18 IMAGING — US US PELVIS COMPLETE WITH TRANSVAGINAL
1 series · 14 of 25 positions shown · non-contrast
Comparison: None

CLINICAL DATA: Initial evaluation for amenorrhea.



[Series 1: us pelvic complete with transvaginal · 14 of 119 slices shown]
[im 1/119]
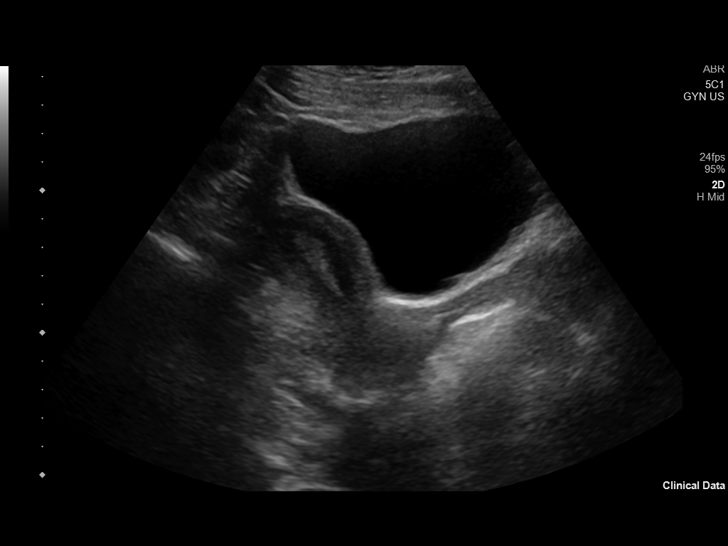
[im 10/119]
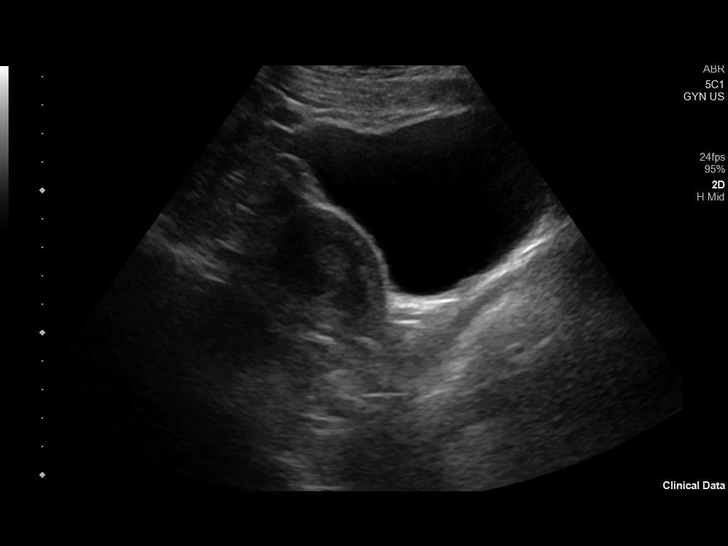
[im 20/119]
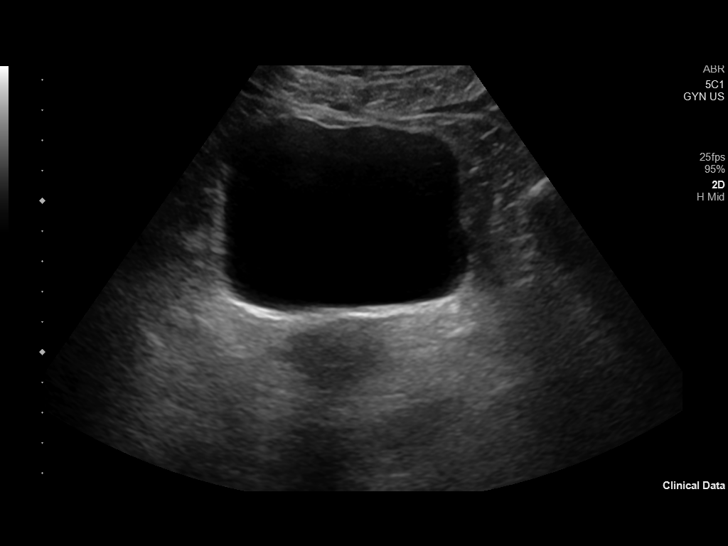
[im 30/119]
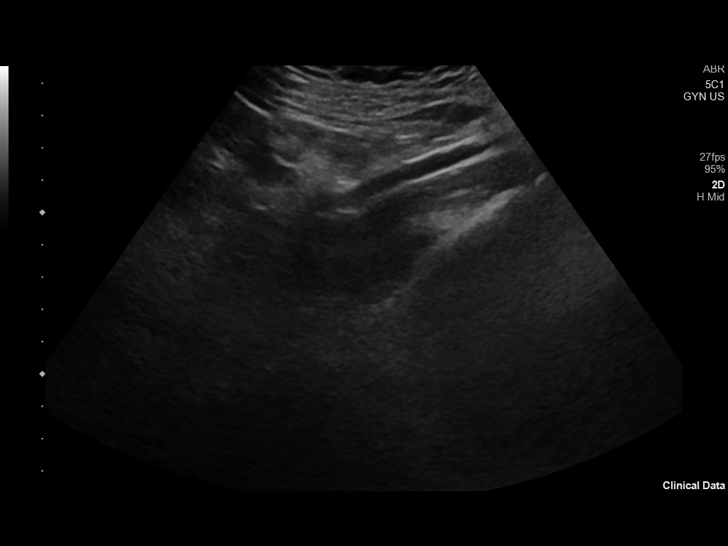
[im 40/119]
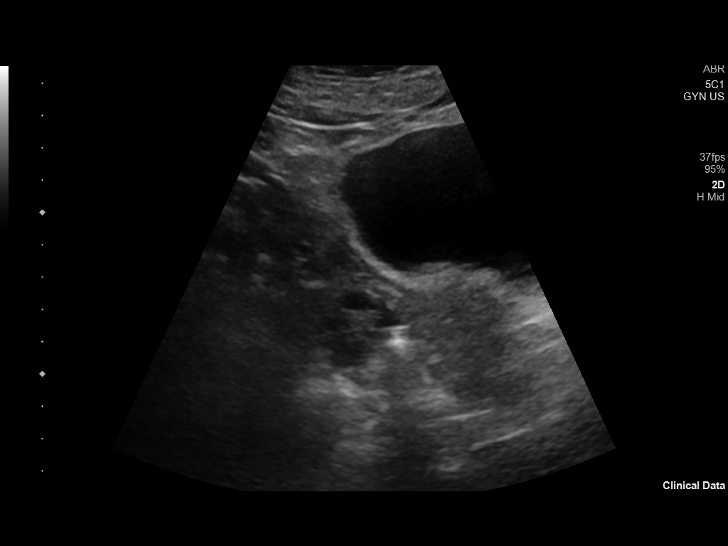
[im 45/119]
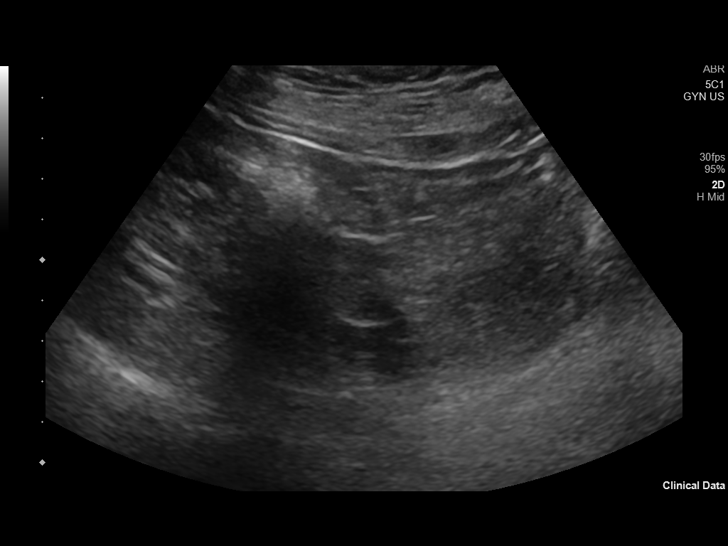
[im 55/119]
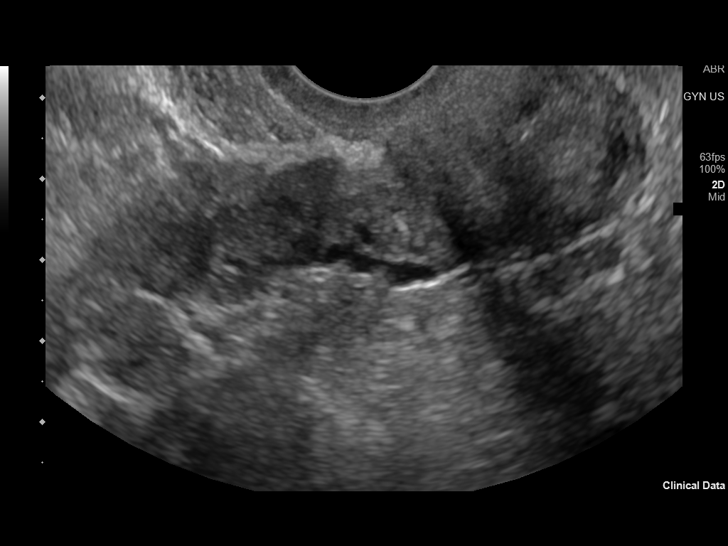
[im 64/119]
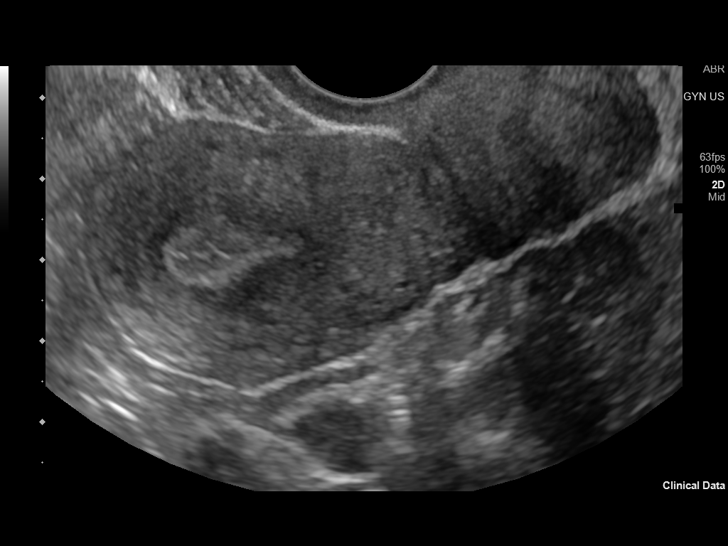
[im 74/119]
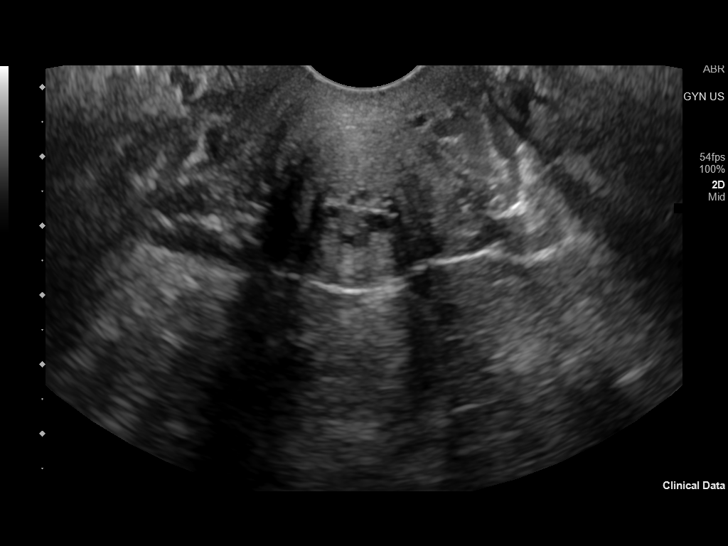
[im 79/119]
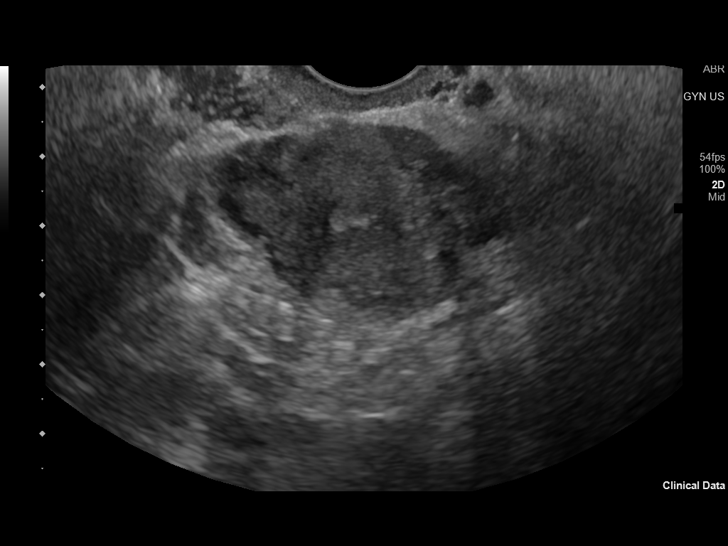
[im 89/119]
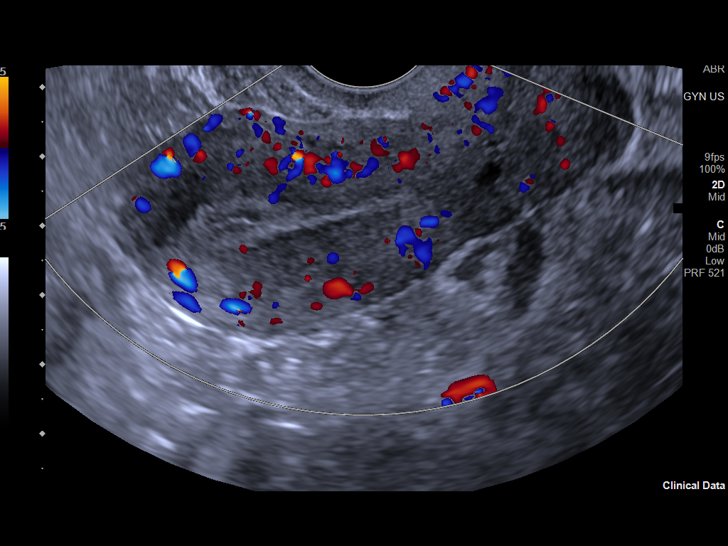
[im 99/119]
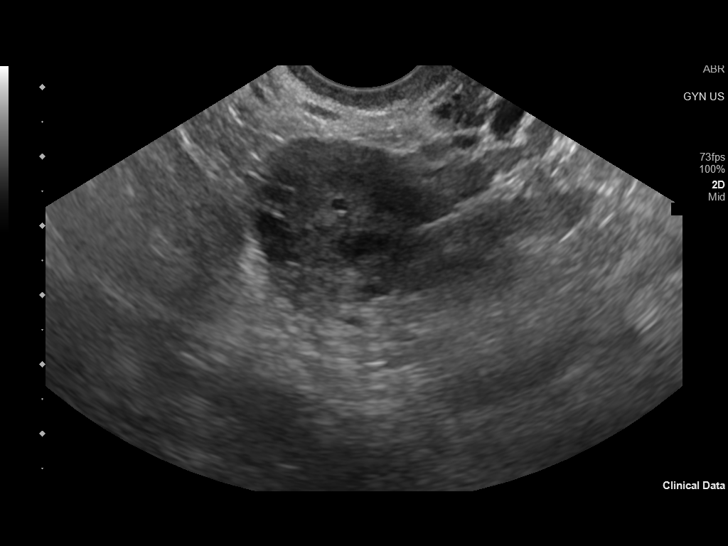
[im 109/119]
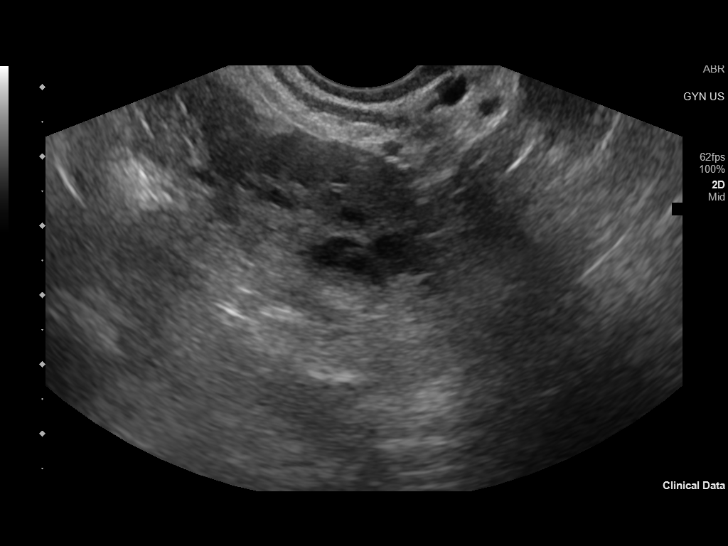
[im 119/119]
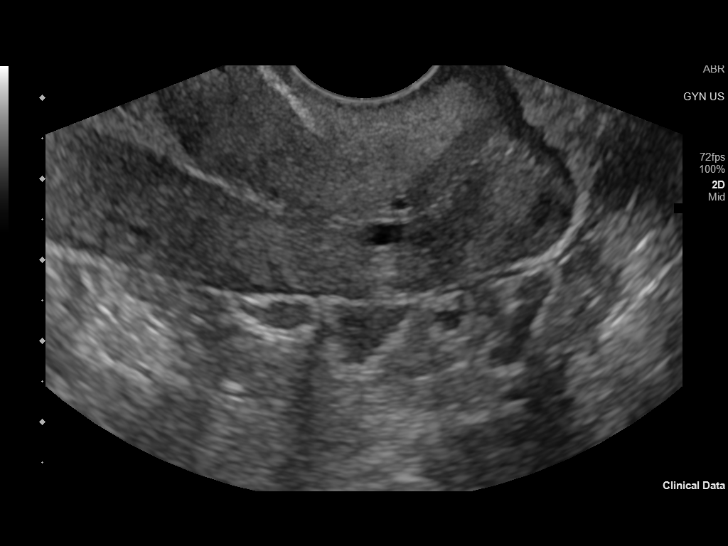

[14 of 25 positions shown; findings below may reference images not displayed]

FINDINGS: Uterus

Measurements: 7.5 x 3.4 x 4.5 cm = volume: 59.5 mL. No fibroids or
other mass visualized.

Endometrium

Thickness: 8 mm.  No focal abnormality visualized.

Right ovary

Measurements: 2.6 x 2.3 x 2.6 cm = volume: 8.0 mL. Normal
appearance/no adnexal mass.

Left ovary

Measurements: 2.8 x 2.0 x 2.1 cm = volume: 6.4 mL. Normal
appearance/no adnexal mass.

Other findings

No abnormal free fluid.
IMPRESSION: Normal pelvic ultrasound.
